# Patient Record
Sex: Female | Born: 1935 | Race: White | Hispanic: No | Smoking: Never smoker
Health system: Southern US, Community
[De-identification: ages and names within clinical notes are randomized; demographics above are authoritative.]

## PROBLEM LIST (undated history)

## (undated) DIAGNOSIS — Z933 Colostomy status: Secondary | ICD-10-CM

## (undated) DIAGNOSIS — I6529 Occlusion and stenosis of unspecified carotid artery: Secondary | ICD-10-CM

## (undated) DIAGNOSIS — R778 Other specified abnormalities of plasma proteins: Secondary | ICD-10-CM

## (undated) DIAGNOSIS — G309 Alzheimer's disease, unspecified: Secondary | ICD-10-CM

## (undated) DIAGNOSIS — C801 Malignant (primary) neoplasm, unspecified: Secondary | ICD-10-CM

## (undated) DIAGNOSIS — F0280 Dementia in other diseases classified elsewhere without behavioral disturbance: Secondary | ICD-10-CM

## (undated) DIAGNOSIS — K219 Gastro-esophageal reflux disease without esophagitis: Secondary | ICD-10-CM

## (undated) DIAGNOSIS — I5189 Other ill-defined heart diseases: Secondary | ICD-10-CM

## (undated) DIAGNOSIS — R7989 Other specified abnormal findings of blood chemistry: Secondary | ICD-10-CM

## (undated) DIAGNOSIS — E039 Hypothyroidism, unspecified: Secondary | ICD-10-CM

## (undated) DIAGNOSIS — I10 Essential (primary) hypertension: Secondary | ICD-10-CM

## (undated) DIAGNOSIS — E785 Hyperlipidemia, unspecified: Secondary | ICD-10-CM

## (undated) HISTORY — PX: ENDARTERECTOMY: SHX5162

## (undated) HISTORY — DX: Other specified abnormalities of plasma proteins: R77.8

## (undated) HISTORY — DX: Other ill-defined heart diseases: I51.89

## (undated) HISTORY — DX: Other specified abnormal findings of blood chemistry: R79.89

## (undated) HISTORY — PX: COLON SURGERY: SHX602

## (undated) HISTORY — PX: HIP SURGERY: SHX245

---

## 2004-05-18 ENCOUNTER — Ambulatory Visit: Payer: Self-pay | Admitting: Internal Medicine

## 2005-06-03 ENCOUNTER — Ambulatory Visit: Payer: Self-pay | Admitting: Internal Medicine

## 2005-06-21 ENCOUNTER — Ambulatory Visit: Payer: Self-pay | Admitting: Internal Medicine

## 2005-07-05 ENCOUNTER — Ambulatory Visit: Payer: Self-pay | Admitting: Vascular Surgery

## 2005-08-03 ENCOUNTER — Inpatient Hospital Stay: Payer: Self-pay | Admitting: Vascular Surgery

## 2006-02-15 ENCOUNTER — Ambulatory Visit: Payer: Self-pay | Admitting: Surgery

## 2006-06-05 ENCOUNTER — Ambulatory Visit: Payer: Self-pay | Admitting: Internal Medicine

## 2007-07-24 ENCOUNTER — Ambulatory Visit: Payer: Self-pay | Admitting: Internal Medicine

## 2008-07-24 ENCOUNTER — Ambulatory Visit: Payer: Self-pay | Admitting: Internal Medicine

## 2008-10-09 ENCOUNTER — Ambulatory Visit: Payer: Self-pay | Admitting: Surgery

## 2009-07-27 ENCOUNTER — Ambulatory Visit: Payer: Self-pay | Admitting: Internal Medicine

## 2009-09-17 ENCOUNTER — Ambulatory Visit: Payer: Self-pay

## 2009-09-23 ENCOUNTER — Inpatient Hospital Stay: Payer: Self-pay | Admitting: Internal Medicine

## 2009-09-30 ENCOUNTER — Encounter: Payer: Self-pay | Admitting: Internal Medicine

## 2009-10-05 ENCOUNTER — Inpatient Hospital Stay: Payer: Self-pay | Admitting: Internal Medicine

## 2009-10-07 ENCOUNTER — Encounter: Payer: Self-pay | Admitting: Internal Medicine

## 2009-11-06 ENCOUNTER — Encounter: Payer: Self-pay | Admitting: Internal Medicine

## 2010-01-29 ENCOUNTER — Ambulatory Visit: Payer: Self-pay | Admitting: Surgery

## 2010-02-03 ENCOUNTER — Ambulatory Visit: Payer: Self-pay | Admitting: Surgery

## 2010-02-11 ENCOUNTER — Inpatient Hospital Stay: Payer: Self-pay | Admitting: Surgery

## 2010-05-02 ENCOUNTER — Emergency Department: Payer: Self-pay | Admitting: Emergency Medicine

## 2010-08-04 ENCOUNTER — Ambulatory Visit: Payer: Self-pay | Admitting: Internal Medicine

## 2011-07-18 ENCOUNTER — Other Ambulatory Visit: Payer: Self-pay | Admitting: Vascular Surgery

## 2011-07-18 LAB — CREATININE, SERUM
EGFR (African American): >60
EGFR (Non-African Amer.): 55 — ABNORMAL LOW

## 2011-07-19 ENCOUNTER — Ambulatory Visit: Payer: Self-pay | Admitting: Vascular Surgery

## 2011-08-01 ENCOUNTER — Ambulatory Visit: Payer: Self-pay | Admitting: Vascular Surgery

## 2011-08-01 DIAGNOSIS — I499 Cardiac arrhythmia, unspecified: Secondary | ICD-10-CM

## 2011-08-01 LAB — BASIC METABOLIC PANEL
Anion Gap: 7 (ref 7–16)
BUN: 11 mg/dL (ref 7–18)
Chloride: 102 mmol/L (ref 98–107)
Co2: 30 mmol/L (ref 21–32)
EGFR (African American): >60
Glucose: 98 mg/dL (ref 65–99)
Potassium: 4.6 mmol/L (ref 3.5–5.1)
Sodium: 139 mmol/L (ref 136–145)

## 2011-08-01 LAB — CBC
HCT: 40.9 % (ref 35.0–47.0)
MCH: 32.2 pg (ref 26.0–34.0)
MCHC: 33.6 g/dL (ref 32.0–36.0)
RBC: 4.27 10*6/uL (ref 3.80–5.20)
RDW: 13.3 % (ref 11.5–14.5)
WBC: 5.2 10*3/uL (ref 3.6–11.0)

## 2011-08-10 ENCOUNTER — Inpatient Hospital Stay: Payer: Self-pay | Admitting: Vascular Surgery

## 2011-08-11 LAB — BASIC METABOLIC PANEL
BUN: 12 mg/dL (ref 7–18)
Chloride: 109 mmol/L — ABNORMAL HIGH (ref 98–107)
Co2: 25 mmol/L (ref 21–32)
Creatinine: 1.05 mg/dL (ref 0.60–1.30)
EGFR (Non-African Amer.): 54 — ABNORMAL LOW
Glucose: 80 mg/dL (ref 65–99)
Osmolality: 286 (ref 275–301)
Potassium: 4 mmol/L (ref 3.5–5.1)

## 2011-08-11 LAB — CBC WITH DIFFERENTIAL/PLATELET
Basophil #: 0 10*3/uL (ref 0.0–0.1)
Basophil %: 0.4 %
Eosinophil #: 0.1 10*3/uL (ref 0.0–0.7)
Eosinophil %: 2.8 %
HCT: 32.9 % — ABNORMAL LOW (ref 35.0–47.0)
HGB: 11 g/dL — ABNORMAL LOW (ref 12.0–16.0)
Lymphocyte #: 0.9 10*3/uL — ABNORMAL LOW (ref 1.0–3.6)
Lymphocyte %: 20.3 %
MCH: 32.2 pg (ref 26.0–34.0)
MCHC: 33.5 g/dL (ref 32.0–36.0)
MCV: 96 fL (ref 80–100)
Monocyte #: 0.7 10*3/uL (ref 0.0–0.7)
Monocyte %: 16.5 %
Neutrophil #: 2.6 10*3/uL (ref 1.4–6.5)
Platelet: 153 10*3/uL (ref 150–440)
RDW: 13.6 % (ref 11.5–14.5)

## 2011-08-11 LAB — PROTIME-INR
INR: 1
Prothrombin Time: 13.4 secs (ref 11.5–14.7)

## 2011-10-06 ENCOUNTER — Ambulatory Visit: Payer: Self-pay | Admitting: Internal Medicine

## 2012-02-22 ENCOUNTER — Ambulatory Visit: Payer: Self-pay | Admitting: Ophthalmology

## 2012-03-05 ENCOUNTER — Ambulatory Visit: Payer: Self-pay | Admitting: Ophthalmology

## 2013-01-15 ENCOUNTER — Inpatient Hospital Stay: Payer: Self-pay | Admitting: Internal Medicine

## 2013-01-15 LAB — BASIC METABOLIC PANEL
Anion Gap: 10 (ref 7–16)
BUN: 13 mg/dL (ref 7–18)
Chloride: 98 mmol/L (ref 98–107)
Co2: 23 mmol/L (ref 21–32)
Creatinine: 1.02 mg/dL (ref 0.60–1.30)
EGFR (African American): 60 — ABNORMAL LOW
Osmolality: 263 (ref 275–301)
Sodium: 131 mmol/L — ABNORMAL LOW (ref 136–145)

## 2013-01-15 LAB — CBC WITH DIFFERENTIAL/PLATELET
Basophil #: 0.1 10*3/uL (ref 0.0–0.1)
Eosinophil #: 0 10*3/uL (ref 0.0–0.7)
Eosinophil %: 0.1 %
HGB: 14.5 g/dL (ref 12.0–16.0)
Lymphocyte #: 0.9 10*3/uL — ABNORMAL LOW (ref 1.0–3.6)
MCHC: 34.9 g/dL (ref 32.0–36.0)
Monocyte #: 1 x10 3/mm — ABNORMAL HIGH (ref 0.2–0.9)
Monocyte %: 9.4 %
Neutrophil #: 8.4 10*3/uL — ABNORMAL HIGH (ref 1.4–6.5)
WBC: 10.3 10*3/uL (ref 3.6–11.0)

## 2013-01-15 LAB — APTT: Activated PTT: 28.9 secs (ref 23.6–35.9)

## 2013-01-15 LAB — TROPONIN I: Troponin-I: <0.02 ng/mL

## 2013-01-15 LAB — PROTIME-INR: Prothrombin Time: 12.4 secs (ref 11.5–14.7)

## 2013-01-16 LAB — TSH: Thyroid Stimulating Horm: 0.324 u[IU]/mL — ABNORMAL LOW

## 2013-01-16 LAB — MAGNESIUM: Magnesium: 1.8 mg/dL

## 2013-01-17 LAB — SODIUM: Sodium: 137 mmol/L (ref 136–145)

## 2013-01-18 ENCOUNTER — Encounter: Payer: Self-pay | Admitting: Internal Medicine

## 2013-01-24 LAB — BASIC METABOLIC PANEL
Calcium, Total: 9.8 mg/dL (ref 8.5–10.1)
Chloride: 102 mmol/L (ref 98–107)
Creatinine: 1.42 mg/dL — ABNORMAL HIGH (ref 0.60–1.30)
Glucose: 104 mg/dL — ABNORMAL HIGH (ref 65–99)
Osmolality: 280 (ref 275–301)
Potassium: 4 mmol/L (ref 3.5–5.1)
Sodium: 137 mmol/L (ref 136–145)

## 2013-01-24 LAB — TSH: Thyroid Stimulating Horm: 8.72 u[IU]/mL — ABNORMAL HIGH

## 2013-01-28 ENCOUNTER — Encounter: Payer: Self-pay | Admitting: Internal Medicine

## 2013-01-28 LAB — URINALYSIS, COMPLETE
Glucose,UR: NEGATIVE mg/dL (ref 0–75)
Ketone: NEGATIVE
Ph: 6 (ref 4.5–8.0)
Specific Gravity: 1.008 (ref 1.003–1.030)

## 2013-01-31 LAB — URINE CULTURE

## 2013-02-04 ENCOUNTER — Ambulatory Visit: Payer: Self-pay | Admitting: Specialist

## 2013-02-06 ENCOUNTER — Encounter: Payer: Self-pay | Admitting: Internal Medicine

## 2013-11-22 ENCOUNTER — Emergency Department: Payer: Self-pay | Admitting: Emergency Medicine

## 2013-11-22 LAB — COMPREHENSIVE METABOLIC PANEL
ANION GAP: 6 — AB (ref 7–16)
Albumin: 4.3 g/dL (ref 3.4–5.0)
Alkaline Phosphatase: 102 U/L
BUN: 19 mg/dL — ABNORMAL HIGH (ref 7–18)
Bilirubin,Total: 1.3 mg/dL — ABNORMAL HIGH (ref 0.2–1.0)
CALCIUM: 9.8 mg/dL (ref 8.5–10.1)
CO2: 25 mmol/L (ref 21–32)
CREATININE: 1.32 mg/dL — AB (ref 0.60–1.30)
Chloride: 106 mmol/L (ref 98–107)
EGFR (African American): 44 — ABNORMAL LOW
GFR CALC NON AF AMER: 38 — AB
GLUCOSE: 143 mg/dL — AB (ref 65–99)
Osmolality: 279 (ref 275–301)
Potassium: 4 mmol/L (ref 3.5–5.1)
SGOT(AST): 41 U/L — ABNORMAL HIGH (ref 15–37)
SGPT (ALT): 34 U/L (ref 12–78)
Sodium: 137 mmol/L (ref 136–145)
TOTAL PROTEIN: 7.6 g/dL (ref 6.4–8.2)

## 2013-11-22 LAB — CBC
HCT: 44.2 % (ref 35.0–47.0)
HGB: 14.8 g/dL (ref 12.0–16.0)
MCH: 32.2 pg (ref 26.0–34.0)
MCHC: 33.4 g/dL (ref 32.0–36.0)
MCV: 96 fL (ref 80–100)
Platelet: 205 10*3/uL (ref 150–440)
RBC: 4.6 10*6/uL (ref 3.80–5.20)
RDW: 12.9 % (ref 11.5–14.5)
WBC: 11.6 10*3/uL — ABNORMAL HIGH (ref 3.6–11.0)

## 2013-11-22 LAB — TROPONIN I

## 2014-08-26 NOTE — Op Note (Signed)
PATIENT NAME:  Rachel Rush, Rachel Rush MR#:  [id number] DATE OF BIRTH:  11/12/1929  DATE OF PROCEDURE:  03/05/2012  PREOPERATIVE DIAGNOSIS:  Cataract, right eye.   POSTOPERATIVE DIAGNOSIS:  Cataract, right eye.  PROCEDURE PERFORMED:  Extracapsular cataract extraction using phacoemulsification with placement of an Alcon [id number], 20.5-diopter posterior chamber lens, serial # [id number].  SURGEON:  Loura Back. Tinisha Etzkorn, MD  ASSISTANT:  None.  ANESTHESIA:  4% lidocaine and 0.75% Marcaine in a 50/50 mixture with 10 units/mL of Hylenex added, given as peribulbar.  ANESTHESIOLOGIST:  Dr. Benjamine Mola   COMPLICATIONS:  None.  ESTIMATED BLOOD LOSS:  Less than 1 mL.  DESCRIPTION OF PROCEDURE:  The patient was brought to the operating room and given a peribulbar block.  The patient was then prepped and draped in the usual fashion.  The vertical rectus muscles were imbricated using 5-0 silk sutures.  These sutures were then clamped to the sterile drapes as bridle sutures.  A limbal peritomy was performed extending two clock hours and hemostasis was obtained with cautery.  A partial thickness scleral groove was made at the surgical limbus and dissected anteriorly in a lamellar dissection using an Alcon crescent knife.  The anterior chamber was entered superonasally with a Superblade and through the lamellar dissection with a 2.6 mm keratome.  DisCoVisc was used to replace the aqueous and a continuous tear capsulorrhexis was carried out.  Hydrodissection and hydrodelineation were carried out with balanced salt and a 27 gauge canula.  The nucleus was rotated to confirm the effectiveness of the hydrodissection.  Phacoemulsification was carried out using a divide-and-conquer technique.  Total ultrasound time was 2 minutes and 18 seconds with an average power of 19.2 percent, CDE 44.02.  Irrigation/aspiration was used to remove the residual cortex.  DisCoVisc was used to inflate the capsule and the internal incision  was enlarged to 3 mm with the crescent knife.  The intraocular lens was folded and inserted into the capsular bag using the AcrySert delivery system.  Irrigation/aspiration was used to remove the residual DisCoVisc.  Miostat was injected into the anterior chamber through the paracentesis track to inflate the anterior chamber and induce miosis.  The wound was checked for leaks and none were found. The conjunctiva was closed with cautery and the bridle sutures were removed.  Two drops of 0.3% Vigamox were placed on the eye.   An eye shield was placed on the eye.  The patient was discharged to the recovery room in good condition.  ____________________________ Loura Back Nesta Kimple, MD sad:drc D: 03/05/2012 13:49:34 ET T: 03/05/2012 14:27:02 ET JOB#: [id number]  cc: Remo Lipps A. Deyna Carbon, MD, <Dictator> Martie Lee MD ELECTRONICALLY SIGNED 03/07/2012 14:59

## 2014-08-29 NOTE — Consult Note (Signed)
Brief Consult Note: Diagnosis: Left hip greater trochanteric hip fracture.   Patient was seen by consultant.   Recommend to proceed with surgery or procedure.   Recommend further assessment or treatment.   Orders entered.   Comments: 82 year old female fell in her yard yesterday and injured the left hip.  Brought to Emergency Room where exam and X-rays show a non displaced fracture of the left greater trochanter where she has a total hip done by Dr Marry Guan years ago.  Lives alone so she was admitted for pain control and placement.  complains of modereate pain currently.  Daughter in law present for exam and discussion.   Exam:  Thin female lying quietly in bed.  Leg lengths equal.  range of motion of hip painful.  circulation/sensation/motor function good.  Hip stable.  No bruising noted as yet.  No other complaints voiced.    X-rays: as above.  No evidence of loosening of components.  Trochanter non displaced.  Femur cemented.   Rx:  symptomatic care..ice, pain meds        Physical Therapy: partial weight bearing with walker on left        Will need skilled nursing facility.          follow-up:  2-3 weeks with me or Dr Marry Guan at family's discretion.  Electronic Signatures: Park Breed (MD)  (Signed 10-Sep-14 13:08)  Authored: Brief Consult Note   Last Updated: 10-Sep-14 13:08 by Park Breed (MD)

## 2014-08-29 NOTE — Discharge Summary (Signed)
PATIENT NAME:  Rachel Rush, Rachel Rush MR#:  [id number] DATE OF BIRTH:  [birth year]  DISCHARGE DIAGNOSES:  1.  Hip fracture, conservative management. Had hip replacement in past.  2.  Hyponatremia due to dehydration.  3.  Colostomy and constipation.  4.  Hypertension.     CONDITION ON DISCHARGE: Stable.   CODE STATUS: FULL CODE.   MEDICATIONS ON DISCHARGE:  1.  Prevacid 30 mg delayed-release capsule once a day.  2.  Vitamin 400 international units once a day.  3.  Fosamax 70 mg oral once a week on Thursday.  4.  Aspirin 81 mg once a day in the morning.  5.  Fish oil 1 gram two times a day.  6.  Levothyroxine 88 mcg once a day.  7.  Vitamin B12 1000 mcg injectable once a month. 9.  Tramadol 50 mg every six hours as needed for pain.  10.  Clopidogrel 75 mg oral once a day.  11.  Metoprolol 25 mg two times a day.  12.  Amlodipine 10 mg oral tablet take 1/2 tablet once a day.  13.  Donepezil 10 mg oral tablet once a day.  14.  Docusate sodium 100 mg oral capsule 2 times a day as needed for constipation.  15.  Metoclopramide 5 mg oral tablet 4 times a day before meals and as needed for nausea and vomiting.  16.  Calcium and vitamin D tablet 2 times a day with meals.   DIET ON DISCHARGE: Low sodium.   ACTIVITY: As tolerated.   TIMEFRAME TO FOLLOW-UP: Within 1 to 2 weeks. Advised to have follow-up in orthopedics, Dr. Delman Cheadle office.   HISTORY OF PRESENT ILLNESS: This is an 81 year old female with medical history of total hip replacement on the left side, hypertension, osteoporosis, hypothyroidism, gastroesophageal reflux disease, who was in her usual state of health, was walking with a cane and she fell down, which was a mechanical fall.   Came to the Emergency Room, and on x-ray it was found fracture around the hardware on left hip. Discuss this finding with orthopedic doctor and he suggested go for pain management and rehab. There was no surgical interventions offered by him for this  patient in this admission.   The patient was admitted to medical floor for pain management and physical therapy and she remained stable, and we are discharging to rehab.   HOSPITAL COURSE AND STAY: The patient stayed in the hospital for pain management and her rehab placement issues. Her blood pressure was running high which came under control with the addition of extra medication and then it came under control. She was a little dehydrated. We gave IV fluid and there was also hyponatremia which came under control and became normal after replacement. 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 For hypothyroidism, we continued her home medications.   IMPORTANT LABORATORY RESULTS IN THE HOSPITAL: Hip, left complete greater trochanteric fracture. Creatinine 1.02, sodium 131, potassium 3.9, chloride 98. White cell count 10.3, hemoglobin 14.5, platelet count 217. INR 0.9. Troponin less than 0.02. Magnesium 1.5.    Abdominal x-ray was done and which was  consistent with constipation. No obvious ascites or obstruction was found.   TOTAL TIME SPENT ON THIS DISCHARGE: 40 minutes.   ____________________________ Ceasar Lund Anselm Jungling, MD vgv:np D: 01/18/2013 10:50:02 ET T: 01/18/2013 17:32:56 ET  JOB#: [id number]  cc: Ceasar Lund. Anselm Jungling, MD, <Dictator> Park Breed, MD Isla Pence, MD Vaughan Basta MD ELECTRONICALLY SIGNED 01/22/2013 23:25

## 2014-08-29 NOTE — H&P (Signed)
PATIENT NAME:  Rachel Rush, HULGAN MR#:  [id number] DATE OF BIRTH:  11/05/1929  DATE OF ADMISSION:  01/15/2013  PRIMARY CARE PHYSICIAN: Dr. Rosario Jacks  ER REFERRING PHYSICIAN: Dr. Mariea Clonts   CHIEF COMPLAINT: Fall.   HISTORY OF PRESENT ILLNESS: An 81 year old female with past medical history of total hip replacement on the left side, hypertension, osteoporosis, hypothyroidism, gastroesophageal reflux disease who was in her usual state of health.  She is walking with a cane.  Today, she fell down, which was a mechanical fall, and she had her hip fracture on the same side on the left, came to the Emergency Room; and as per ER physician, x-ray is done which shows the fracture is around her hardware on the bone. She discussed the finding, and Dr. Sabra Heck reviewed the x-ray, and he said there is no surgical treatment for this.  He suggested to go for the pain management and then  rehab, so she is being admitted to medical service for further management of this issue. On further questioning, the patient denies any other complaint other than some pain in the left hip, and she did not recall any syncope or any other complaint. Her daughter-in-law and her son were there when fell down, and there were no other complaints.   REVIEW OF SYSTEMS:   CONSTITUTIONAL: Negative for fever, fatigue, weakness, pain or weight loss.  EYES: No blurring, double vision or discharge.  EARS, NOSE, THROAT: No tinnitus, ear pain or hearing loss.  RESPIRATORY: No cough, wheezing, hemoptysis or shortness of breath.  CARDIOVASCULAR: No chest pain, orthopnea, edema or palpitations.  GASTROINTESTINAL: No nausea, vomiting, diarrhea or abdominal pain.  GENITOURINARY: No dysuria, hematuria or increased frequency.  ENDOCRINE: No increased sweating. No heat or cold intolerance.  SKIN: No acne or rashes.  MUSCULOSKELETAL: No pain or swelling in the joints.  NEUROLOGICAL: No numbness, weakness, tremors or vertigo.  PSYCHIATRIC: No anxiety,  insomnia or bipolar disorder. The patient is complaining of some pain in the left hip but is still able to move her left foot.    PAST MEDICAL HISTORY:  1.  Back pain and T12 vertebrobasilar fracture.  2.  Hypertension.  3.  Osteoporosis.  4.  Irritable bowel syndrome.  5.  Bilateral carotid artery stenosis, status post endarterectomy.  6.  Hypothyroidism.  7.  Gastroesophageal reflux disease.   PAST SURGICAL HISTORY:  1.  Colon cancer, status post resection and colostomy since age of 89.  2.  Next left hip replacement.  3.  Hernia repair.  4.  Hysterectomy.  5.  Cholecystectomy  6.  Appendectomy.   FAMILY HISTORY: Mother had coronary artery disease and diabetes.   SOCIAL HISTORY: She lives at home. She is independent in her day-to-day activity using cane to walk, and her son and daughter-in-law live next door and are taking care of her other requirements.   HOME MEDICATIONS:   1.  Vitamin E 400 international units once a day.  2.  Vitamin B12 1000 mcg injectable once a month.  3.  Prevacid 30 mg delayed-capsule, 1 capsule once a day.  4.  Plavix 75 mg oral once a day.  5.  Levothyroxine 88 mcg once a day.  6.  Fosamax 70 mg oral tablet once a week.  7.  Fish oil 1 gram 2 times a day.  8.  Dicyclomine 10 mg capsule 3 times a day.  9.  Centrum Silver once a day.  10.  Calcium plus vitamin D tablet 3 times a day.  11.  Aspirin 81 mg once a day.  12.  Aricept 10 mg oral tablet once a day   PHYSICAL EXAMINATION: VITAL SIGNS: In the ER, temperature 98.4, pulse 71, respirations 18, blood pressure 170/103.  GENERAL: The patient is fully alert and oriented to time, place and person, does not appear in any acute distress.  HEAD, EYES, EARS, NOSE, AND THROAT: Head and neck atraumatic. Conjunctivae pink. Oral mucosa moist.  NECK: Supple. No JVD.  RESPIRATORY: Bilateral clear and equal air entry.  CARDIOVASCULAR: S1, S2 present, regular. No murmur.  ABDOMEN: Soft, nontender.  Colostomy bag present. Bowel sounds present.  SKIN: No rashes.  LEGS: No edema.  NEUROLOGICAL: Power 5 out of 5 except left lower limb, which she is not moving too much due to pain in the hip. JOINTS: No swelling or tenderness.  PSYCHIATRIC: Does not appear in any acute psychiatric illness.   IMPORTANT LAB RESULTS: Glucose 96, BUN 13, creatinine 1.02, sodium 131, potassium 3.9, CO2 23, anion gap 10, calcium 10. Troponin less than 0.02. White cell count 10.3, hemoglobin 14.5, platelet count 217, MCV 93, INR 0.9.   ASSESSMENT AND PLAN: An 80 year old female with history of total left hip replacement surgery in the past and osteoporosis, had a fall today and developed a fracture of the bone around her prosthesis. ER physician spoke to Dr. Sabra Heck, who is covering orthopedic surgeon, and there is no surgical intervention at this time.   1.  Hip fracture: As per Ortho, there is no surgical intervention treatment, so the treatment is pain management and rehab. We will arrange for it, so she is admitted to medical care.  2.  Osteoporosis: We will give her vitamin D and calcium.  3.  History of carotid artery stenosis and endarterectomy:  We will continue aspirin and Plavix as she was taking at home.  4.  Hypothyroidism:  We will continue levothyroxine.  5.  Gastroesophageal reflux disease:  We will continue pantoprazole.  6.  Hypertension: We will continue telmisartan.   CODE STATUS:  FULL CODE.        TOTAL TIME SPENT: 50 minutes in this admission.    ____________________________ Ceasar Lund Anselm Jungling, MD vgv:cb D: 01/15/2013 21:33:53 ET T: 01/15/2013 21:48:19 ET JOB#: [id number]  cc: Ceasar Lund. Anselm Jungling, MD, <Dictator> Vaughan Basta MD ELECTRONICALLY SIGNED 01/22/2013 23:23

## 2014-08-31 NOTE — Op Note (Signed)
PATIENT NAME:  Rachel Rush, Rachel Rush MR#:  [id number] DATE OF BIRTH:  [birth year]  DATE OF PROCEDURE:  08/10/2011  PREOPERATIVE DIAGNOSES: 1. High-grade right carotid artery stenosis.  2. Status post left carotid endarterectomy.  3. Hypertension.   POSTOPERATIVE DIAGNOSES:  1. High-grade right carotid artery stenosis.  2. Status post left carotid endarterectomy.  3. Hypertension.   PROCEDURE: Right carotid endarterectomy with  Dacron patch angioplasty.   SURGEON: Algernon Huxley, M.D.   ASSISTANT:  Real Cons, PA   ANESTHESIA: General.   ESTIMATED BLOOD LOSS: Approximately 50 mL.   INDICATION FOR PROCEDURE: 81 year old white female with history of carotid disease. She has had progression of her disease and most recent ultrasound and CT angiogram demonstrated high-grade stenosis on the right. This is asymptomatic but for stroke risk reduction we discussed endarterectomy for this high-grade lesion. She is already status post left carotid endarterectomy. She desired to proceed.   DESCRIPTION OF PROCEDURE: The patient was brought to the operative suite and after an adequate level of general endotracheal anesthesia was obtained, her head was flexed and turned to the side. She was placed in modified beach chair position and her neck was then sterilely prepped and draped. An incision was created along the anterior border of the sternocleidomastoid and we dissected down through the platysma with electrocautery and then used a Weitlaner  retractor to facilitate our exposure. The carotid artery was identified. There was not a dominant facial vein. A couple of venous branches were ligated and divided between silk ties, exposing the carotid bifurcation. The common carotid artery, external carotid artery, and superior thyroid artery, and internal carotid artery distal to the lesion were all encircled with vessel loops. The patient was systemically heparinized with 5000 units of intravenous heparin. This was  allowed to circulate for approximately five minutes and then control was pulled up on the vessel loops. An anterior wall arteriotomy was created with a #11 blade and extended with Potts scissors. A Pruitt-Inahara shunt was placed first in the internal carotid artery, flushed and de-aired, and then in the common carotid artery, and then flow was restored.  I then performed an endarterectomy in the typical fashion with the [facility].  The proximal endpoint was cut flush with tenotomy scissors. An eversion endarterectomy was performed on the external carotid artery and a nice feathered distal endpoint was created with gentle traction on the distal endpoint. Two 7-0 Prolene sutures were used to tack down the distal endpoint. All loose flecks were removed. The vessel was locally heparinized. Due to the small nature of the vessel a Dacron patch was selected to close the arteriotomy. This was cut and beveled and started at the distal endpoint. It was run one-half the length of the arteriotomy and then the Dacron patch cut and beveled to an appropriate length to match the arteriotomy.  It was beveled proximally and a second 6-0 Prolene was started proximally.  This was run medially and the suture lines were tied together. This was running approximately one-quarter of the length of the arteriotomy laterally and then the Pruitt-Inahara shunt was removed. The vessel was flushed in the external, internal, and common carotid artery and locally heparinized. Then I completed the suture line, flushing through the external carotid artery several cardiac cycles before release of control. Three 6-0 Prolene patch sutures were used for hemostasis and hemostasis was complete. Surgicel and Evicyl topical hemostatic agents were placed as well. The wound was then closed with 3 interrupted 3-0 Vicryl sutures in  the sternocleidomastoid.  The platysma was closed with a running 3-0 Vicryl and the skin was closed with 4-0 Monocryl.  Dermabond was placed as a dressing. The patient tolerated the procedure well and was taken to the recovery room in stable condition.    ____________________________ Algernon Huxley, MD jsd:bjt D: 08/10/2011 09:29:54 ET T: 08/10/2011 12:50:06 ET JOB#: [id number]  cc: Algernon Huxley, MD, <Dictator> Isla Pence, MD Algernon Huxley MD ELECTRONICALLY SIGNED 08/10/2011 15:05

## 2015-03-24 ENCOUNTER — Observation Stay
Admission: EM | Admit: 2015-03-24 | Discharge: 2015-03-27 | Disposition: A | Discharge status: Skilled Nursing Facility | Payer: Medicare Other | Attending: Internal Medicine | Admitting: Internal Medicine

## 2015-03-24 ENCOUNTER — Emergency Department: Payer: Medicare Other

## 2015-03-24 ENCOUNTER — Encounter: Payer: Self-pay | Admitting: *Deleted

## 2015-03-24 DIAGNOSIS — R7989 Other specified abnormal findings of blood chemistry: Secondary | ICD-10-CM | POA: Insufficient documentation

## 2015-03-24 DIAGNOSIS — Z885 Allergy status to narcotic agent status: Secondary | ICD-10-CM | POA: Insufficient documentation

## 2015-03-24 DIAGNOSIS — K219 Gastro-esophageal reflux disease without esophagitis: Secondary | ICD-10-CM | POA: Insufficient documentation

## 2015-03-24 DIAGNOSIS — R748 Abnormal levels of other serum enzymes: Secondary | ICD-10-CM | POA: Diagnosis not present

## 2015-03-24 DIAGNOSIS — E785 Hyperlipidemia, unspecified: Secondary | ICD-10-CM | POA: Diagnosis not present

## 2015-03-24 DIAGNOSIS — M4854XA Collapsed vertebra, not elsewhere classified, thoracic region, initial encounter for fracture: Secondary | ICD-10-CM | POA: Insufficient documentation

## 2015-03-24 DIAGNOSIS — Z859 Personal history of malignant neoplasm, unspecified: Secondary | ICD-10-CM | POA: Insufficient documentation

## 2015-03-24 DIAGNOSIS — G309 Alzheimer's disease, unspecified: Secondary | ICD-10-CM | POA: Diagnosis not present

## 2015-03-24 DIAGNOSIS — Z79899 Other long term (current) drug therapy: Secondary | ICD-10-CM | POA: Diagnosis not present

## 2015-03-24 DIAGNOSIS — N289 Disorder of kidney and ureter, unspecified: Secondary | ICD-10-CM | POA: Diagnosis not present

## 2015-03-24 DIAGNOSIS — Z933 Colostomy status: Secondary | ICD-10-CM | POA: Insufficient documentation

## 2015-03-24 DIAGNOSIS — I517 Cardiomegaly: Secondary | ICD-10-CM | POA: Diagnosis not present

## 2015-03-24 DIAGNOSIS — I501 Left ventricular failure: Secondary | ICD-10-CM | POA: Insufficient documentation

## 2015-03-24 DIAGNOSIS — N390 Urinary tract infection, site not specified: Secondary | ICD-10-CM | POA: Diagnosis not present

## 2015-03-24 DIAGNOSIS — Z7982 Long term (current) use of aspirin: Secondary | ICD-10-CM | POA: Diagnosis not present

## 2015-03-24 DIAGNOSIS — I071 Rheumatic tricuspid insufficiency: Secondary | ICD-10-CM | POA: Insufficient documentation

## 2015-03-24 DIAGNOSIS — I214 Non-ST elevation (NSTEMI) myocardial infarction: Principal | ICD-10-CM | POA: Diagnosis present

## 2015-03-24 DIAGNOSIS — Z8249 Family history of ischemic heart disease and other diseases of the circulatory system: Secondary | ICD-10-CM | POA: Insufficient documentation

## 2015-03-24 DIAGNOSIS — B9689 Other specified bacterial agents as the cause of diseases classified elsewhere: Secondary | ICD-10-CM | POA: Insufficient documentation

## 2015-03-24 DIAGNOSIS — R079 Chest pain, unspecified: Secondary | ICD-10-CM

## 2015-03-24 DIAGNOSIS — M8580 Other specified disorders of bone density and structure, unspecified site: Secondary | ICD-10-CM | POA: Insufficient documentation

## 2015-03-24 DIAGNOSIS — R410 Disorientation, unspecified: Secondary | ICD-10-CM | POA: Insufficient documentation

## 2015-03-24 DIAGNOSIS — D72829 Elevated white blood cell count, unspecified: Secondary | ICD-10-CM

## 2015-03-24 DIAGNOSIS — I340 Nonrheumatic mitral (valve) insufficiency: Secondary | ICD-10-CM | POA: Diagnosis not present

## 2015-03-24 DIAGNOSIS — Z91013 Allergy to seafood: Secondary | ICD-10-CM | POA: Insufficient documentation

## 2015-03-24 DIAGNOSIS — I272 Other secondary pulmonary hypertension: Secondary | ICD-10-CM | POA: Diagnosis not present

## 2015-03-24 DIAGNOSIS — F0280 Dementia in other diseases classified elsewhere without behavioral disturbance: Secondary | ICD-10-CM | POA: Diagnosis not present

## 2015-03-24 DIAGNOSIS — I6529 Occlusion and stenosis of unspecified carotid artery: Secondary | ICD-10-CM | POA: Insufficient documentation

## 2015-03-24 DIAGNOSIS — I10 Essential (primary) hypertension: Secondary | ICD-10-CM | POA: Insufficient documentation

## 2015-03-24 DIAGNOSIS — E039 Hypothyroidism, unspecified: Secondary | ICD-10-CM | POA: Diagnosis not present

## 2015-03-24 DIAGNOSIS — R531 Weakness: Secondary | ICD-10-CM | POA: Diagnosis not present

## 2015-03-24 DIAGNOSIS — E860 Dehydration: Secondary | ICD-10-CM | POA: Diagnosis not present

## 2015-03-24 DIAGNOSIS — R778 Other specified abnormalities of plasma proteins: Secondary | ICD-10-CM

## 2015-03-24 HISTORY — DX: Dementia in other diseases classified elsewhere, unspecified severity, without behavioral disturbance, psychotic disturbance, mood disturbance, and anxiety: F02.80

## 2015-03-24 HISTORY — DX: Colostomy status: Z93.3

## 2015-03-24 HISTORY — DX: Malignant (primary) neoplasm, unspecified: C80.1

## 2015-03-24 HISTORY — DX: Gastro-esophageal reflux disease without esophagitis: K21.9

## 2015-03-24 HISTORY — DX: Occlusion and stenosis of unspecified carotid artery: I65.29

## 2015-03-24 HISTORY — DX: Hyperlipidemia, unspecified: E78.5

## 2015-03-24 HISTORY — DX: Hypothyroidism, unspecified: E03.9

## 2015-03-24 HISTORY — DX: Essential (primary) hypertension: I10

## 2015-03-24 HISTORY — DX: Alzheimer's disease, unspecified: G30.9

## 2015-03-24 LAB — TROPONIN I: Troponin I: 0.07 ng/mL — ABNORMAL HIGH (ref <0.031)

## 2015-03-24 LAB — BASIC METABOLIC PANEL
Anion gap: 7 (ref 5–15)
BUN: 26 mg/dL — AB (ref 6–20)
CHLORIDE: 105 mmol/L (ref 101–111)
CO2: 27 mmol/L (ref 22–32)
CREATININE: 1.15 mg/dL — AB (ref 0.44–1.00)
Calcium: 9.5 mg/dL (ref 8.9–10.3)
GFR calc Af Amer: 50 mL/min — ABNORMAL LOW (ref >60)
GFR calc non Af Amer: 43 mL/min — ABNORMAL LOW (ref >60)
GLUCOSE: 121 mg/dL — AB (ref 65–99)
POTASSIUM: 4.3 mmol/L (ref 3.5–5.1)
Sodium: 139 mmol/L (ref 135–145)

## 2015-03-24 LAB — HEPATIC FUNCTION PANEL
ALK PHOS: 86 U/L (ref 38–126)
ALT: 27 U/L (ref 14–54)
AST: 29 U/L (ref 15–41)
Albumin: 4.6 g/dL (ref 3.5–5.0)
BILIRUBIN INDIRECT: 1.4 mg/dL — AB (ref 0.3–0.9)
BILIRUBIN TOTAL: 1.8 mg/dL — AB (ref 0.3–1.2)
Bilirubin, Direct: 0.4 mg/dL (ref 0.1–0.5)
TOTAL PROTEIN: 7.5 g/dL (ref 6.5–8.1)

## 2015-03-24 LAB — CBC
HCT: 44.3 % (ref 35.0–47.0)
Hemoglobin: 14.2 g/dL (ref 12.0–16.0)
MCH: 30.5 pg (ref 26.0–34.0)
MCHC: 32 g/dL (ref 32.0–36.0)
MCV: 95.4 fL (ref 80.0–100.0)
PLATELETS: 212 10*3/uL (ref 150–440)
RBC: 4.65 MIL/uL (ref 3.80–5.20)
RDW: 14.8 % — AB (ref 11.5–14.5)
WBC: 14.4 10*3/uL — ABNORMAL HIGH (ref 3.6–11.0)

## 2015-03-24 LAB — URINALYSIS COMPLETE WITH MICROSCOPIC (ARMC ONLY)
BILIRUBIN URINE: NEGATIVE
Bacteria, UA: NONE SEEN
Glucose, UA: NEGATIVE mg/dL
Hgb urine dipstick: NEGATIVE
Ketones, ur: NEGATIVE mg/dL
Nitrite: NEGATIVE
Protein, ur: NEGATIVE mg/dL
RBC / HPF: NONE SEEN RBC/hpf (ref 0–5)
SQUAMOUS EPITHELIAL / LPF: NONE SEEN
Specific Gravity, Urine: 1.011 (ref 1.005–1.030)
pH: 5 (ref 5.0–8.0)

## 2015-03-24 LAB — BRAIN NATRIURETIC PEPTIDE: B Natriuretic Peptide: 98 pg/mL (ref 0.0–100.0)

## 2015-03-24 MED ORDER — ACETAMINOPHEN 325 MG PO TABS: 650.0000 mg | ORAL_TABLET | Freq: Four times a day (QID) | ORAL | Status: DC | PRN

## 2015-03-24 MED ORDER — ONDANSETRON HCL 4 MG/2ML IJ SOLN: 4.0000 mg | Freq: Four times a day (QID) | INTRAMUSCULAR | Status: DC | PRN

## 2015-03-24 MED ORDER — CIPROFLOXACIN HCL 500 MG PO TABS
500.0000 mg | ORAL_TABLET | Freq: Two times a day (BID) | ORAL | Status: DC
  Administered 2015-03-24 – 2015-03-25 (×2): 500 mg via ORAL
  Filled 2015-03-24 (×2): qty 1

## 2015-03-24 MED ORDER — ADULT MULTIVITAMIN W/MINERALS CH
1.0000 | ORAL_TABLET | Freq: Every evening | ORAL | Status: DC
  Administered 2015-03-24 – 2015-03-26 (×3): 1 via ORAL
  Filled 2015-03-24 (×3): qty 1

## 2015-03-24 MED ORDER — ASPIRIN EC 81 MG PO TBEC
81.0000 mg | DELAYED_RELEASE_TABLET | Freq: Every day | ORAL | Status: DC
  Administered 2015-03-25 – 2015-03-27 (×3): 81 mg via ORAL
  Filled 2015-03-24 (×3): qty 1

## 2015-03-24 MED ORDER — VITAMIN E 180 MG (400 UNIT) PO CAPS
400.0000 [IU] | ORAL_CAPSULE | Freq: Every day | ORAL | Status: DC
  Administered 2015-03-25 – 2015-03-27 (×3): 400 [IU] via ORAL
  Filled 2015-03-24 (×3): qty 1

## 2015-03-24 MED ORDER — ASPIRIN 81 MG PO CHEW
324.0000 mg | CHEWABLE_TABLET | Freq: Once | ORAL | Status: AC
Start: 2015-03-24 — End: 2015-03-24
  Administered 2015-03-24: 324 mg via ORAL
  Filled 2015-03-24: qty 4

## 2015-03-24 MED ORDER — PANTOPRAZOLE SODIUM 20 MG PO TBEC
20.0000 mg | DELAYED_RELEASE_TABLET | Freq: Every day | ORAL | Status: DC
  Filled 2015-03-24: qty 1

## 2015-03-24 MED ORDER — IBUPROFEN 200 MG PO TABS
400.0000 mg | ORAL_TABLET | Freq: Every evening | ORAL | Status: DC
  Administered 2015-03-25 – 2015-03-26 (×2): 400 mg via ORAL
  Filled 2015-03-24 (×2): qty 2

## 2015-03-24 MED ORDER — CALCIUM CARB-CHOLECALCIFEROL 600-800 MG-UNIT PO TABS: 1.0000 | ORAL_TABLET | Freq: Two times a day (BID) | ORAL | Status: DC

## 2015-03-24 MED ORDER — HYDRALAZINE HCL 20 MG/ML IJ SOLN
10.0000 mg | Freq: Four times a day (QID) | INTRAMUSCULAR | Status: DC | PRN
  Administered 2015-03-25: 10 mg via INTRAVENOUS
  Filled 2015-03-24 (×2): qty 1

## 2015-03-24 MED ORDER — DONEPEZIL HCL 5 MG PO TABS
5.0000 mg | ORAL_TABLET | Freq: Every day | ORAL | Status: DC
  Administered 2015-03-25 – 2015-03-27 (×3): 5 mg via ORAL
  Filled 2015-03-24 (×3): qty 1

## 2015-03-24 MED ORDER — OMEGA-3-ACID ETHYL ESTERS 1 G PO CAPS
1.0000 g | ORAL_CAPSULE | Freq: Every day | ORAL | Status: DC
  Administered 2015-03-25 – 2015-03-27 (×3): 1 g via ORAL
  Filled 2015-03-24 (×3): qty 1

## 2015-03-24 MED ORDER — MAPROTILINE HCL 50 MG PO TABS
75.0000 mg | ORAL_TABLET | Freq: Every day | ORAL | Status: DC
  Administered 2015-03-25 – 2015-03-27 (×3): 75 mg via ORAL
  Filled 2015-03-24 (×4): qty 1

## 2015-03-24 MED ORDER — CALCIUM CARBONATE-VITAMIN D 500-200 MG-UNIT PO TABS
1.0000 | ORAL_TABLET | Freq: Two times a day (BID) | ORAL | Status: DC
  Administered 2015-03-24 – 2015-03-27 (×6): 1 via ORAL
  Filled 2015-03-24 (×6): qty 1

## 2015-03-24 MED ORDER — ONDANSETRON HCL 4 MG PO TABS: 4.0000 mg | ORAL_TABLET | Freq: Four times a day (QID) | ORAL | Status: DC | PRN

## 2015-03-24 MED ORDER — VITAMIN B-12 1000 MCG PO TABS
1000.0000 ug | ORAL_TABLET | Freq: Every day | ORAL | Status: DC
  Administered 2015-03-25 – 2015-03-27 (×3): 1000 ug via ORAL
  Filled 2015-03-24 (×3): qty 1

## 2015-03-24 MED ORDER — ACETAMINOPHEN 650 MG RE SUPP: 650.0000 mg | Freq: Four times a day (QID) | RECTAL | Status: DC | PRN

## 2015-03-24 MED ORDER — NITROGLYCERIN 2 % TD OINT
0.5000 [in_us] | TOPICAL_OINTMENT | Freq: Once | TRANSDERMAL | Status: AC
  Administered 2015-03-24: 0.5 [in_us] via TOPICAL
  Filled 2015-03-24: qty 1

## 2015-03-24 MED ORDER — LEVOTHYROXINE SODIUM 100 MCG PO TABS
100.0000 ug | ORAL_TABLET | Freq: Every day | ORAL | Status: DC
  Administered 2015-03-25 – 2015-03-27 (×3): 100 ug via ORAL
  Filled 2015-03-24 (×3): qty 1

## 2015-03-24 MED ORDER — METOPROLOL TARTRATE 25 MG PO TABS
25.0000 mg | ORAL_TABLET | Freq: Two times a day (BID) | ORAL | Status: DC
Start: 2015-03-24 — End: 2015-03-27
  Administered 2015-03-24 – 2015-03-27 (×6): 25 mg via ORAL
  Filled 2015-03-24 (×6): qty 1

## 2015-03-24 MED ORDER — SODIUM CHLORIDE 0.9 % IV SOLN
Freq: Once | INTRAVENOUS | Status: AC
  Administered 2015-03-24: 23:00:00 via INTRAVENOUS

## 2015-03-24 MED ORDER — NITROGLYCERIN 2 % TD OINT
TOPICAL_OINTMENT | TRANSDERMAL | Status: AC
  Filled 2015-03-24: qty 1

## 2015-03-24 MED ORDER — ENOXAPARIN SODIUM 40 MG/0.4ML ~~LOC~~ SOLN
40.0000 mg | SUBCUTANEOUS | Status: DC
  Administered 2015-03-24: 40 mg via SUBCUTANEOUS
  Filled 2015-03-24: qty 0.4

## 2015-03-24 NOTE — ED Provider Notes (Signed)
[facility] Emergency Department Provider Note  ____________________________________________  Time seen: Approximately 4:18 PM  I have reviewed the triage vital signs and the nursing notes.   HISTORY  Chief Complaint Chest Pain    HPI Rachel Rush is a 81 y.o. female patient lives at home. 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 This is work for 3 years but in the last week patient has been much more absent-minded. Today the son went to check on her 5:30 in the morning before going to work and found her sitting on the lower in the kitchen complaining of chest pain. She had not pushed her life alert which she wears on her neck. Patient tells me she has pain in the lower part of her chest she thinks is been there all day. She cannot tell me if anything makes it better or worse. She cannot tell me how the pain feels or if it radiates anywhere. Patient does say she is not short of breath she is not sweaty she's not nauseated she does not have any pain with deep breathing. He tells me she is not coughing and does not think she's running a fever. She cannot tell me when the pain started or if it's waxed and waned during the course of the day. Son is worried about her decline this week and thinks that he may need to place her.   Past Medical History  Diagnosis Date  . Thyroid disease   . Cancer ([place])   . GERD (gastroesophageal reflux disease)   . Hypertension   . Stenosis of carotid artery     There are no active problems to display for this patient.   Past Surgical History  Procedure Laterality Date  . Colon surgery    . Hip surgery    . Endarterectomy      Current Outpatient Rx  Name  Route  Sig  Dispense  Refill  . donepezil (ARICEPT) 5 MG tablet   Oral   Take 5 mg by mouth at bedtime.         . lansoprazole (PREVACID) 30 MG capsule   Oral   Take 30 mg by mouth daily at 12 noon.          Marland Kitchen levothyroxine (SYNTHROID, LEVOTHROID) 100 MCG tablet   Oral   Take 100 mcg by mouth daily before breakfast.           Allergies Cod and Morphine and related  No family history on file.  Social History Social History  Substance Use Topics  . Smoking status: Never Smoker   . Smokeless tobacco: None  . Alcohol Use: No    Review of Systems Constitutional: No fever/chills Eyes: No visual changes. ENT: No sore throat. Cardiovascular: Deniesies shortness of breath. Gastrointestinal: No abdominal pain.  No nausea, no vomiting.  No diarrhea.  No constipation. Genitourinary: Negative for dysuria. Musculoskeletal: Negative for back pain. Skin: Negative for rash. Neurological: Negative for headaches, focal weakness or numbness.  10-point ROS otherwise negative.  ____________________________________________   PHYSICAL EXAM:  VITAL SIGNS: ED Triage Vitals  Enc Vitals Group     BP 03/24/15 1306 171/69 mmHg     Pulse Rate 03/24/15 1306 78     Resp 03/24/15 1306 20     Temp 03/24/15 1306 98.2 F (36.8 C)     Temp Source 03/24/15 1306 Oral     SpO2  03/24/15 1306 100 %     Weight 03/24/15 1306 120 lb (54.432 kg)     Height 03/24/15 1306 5\' 5"  (1.651 m)     Head Cir --      Peak Flow --      Pain Score 03/24/15 1306 7     Pain Loc --      Pain Edu? --      Excl. in [place]? --     Constitutional: Alert.  Well appearing and in no acute distress. Eyes: Conjunctivae are normal. PERRL. EOMI. Head: Atraumatic. Nose: No congestion/rhinnorhea. Mouth/Throat: Mucous membranes are moist.  Oropharynx non-erythematous. Neck: No stridor.   Cardiovascular: Normal rate, regular rhythm. Grossly normal heart sounds.  Good peripheral circulation. Respiratory: Normal respiratory effort.  No retractions. Lungs CTAB. Gastrointestinal: Soft and nontender. No distention. No abdominal bruits. No CVA tenderness. Musculoskeletal: No lower extremity tenderness nor edema.  No joint  effusions. Neurologic:  Normal speech and language. No gross focal neurologic deficits are appreciated. No gait instability. Skin:  Skin is warm, dry and intact. No rash noted.   ____________________________________________   LABS (all labs ordered are listed, but only abnormal results are displayed)  Labs Reviewed  BASIC METABOLIC PANEL - Abnormal; Notable for the following:    Glucose, Bld 121 (*)    BUN 26 (*)    Creatinine, Ser 1.15 (*)    GFR calc non Af Amer 43 (*)    GFR calc Af Amer 50 (*)    All other components within normal limits  CBC - Abnormal; Notable for the following:    WBC 14.4 (*)    RDW 14.8 (*)    All other components within normal limits  TROPONIN I - Abnormal; Notable for the following:    Troponin I 0.07 (*)    All other components within normal limits  HEPATIC FUNCTION PANEL - Abnormal; Notable for the following:    Total Bilirubin 1.8 (*)    Indirect Bilirubin 1.4 (*)    All other components within normal limits  BRAIN NATRIURETIC PEPTIDE  URINALYSIS COMPLETEWITH MICROSCOPIC ([facility] ONLY)   ____________________________________________  EKG  KG read and interpreted by me normal sinus rhythm at a rate of 76 normal axis no acute changes ____________________________________________  RADIOLOGY  Chest x-ray read by radiology course interstitial markings otherwise ____________________________________________   PROCEDURES    ____________________________________________   INITIAL IMPRESSION / ASSESSMENT AND PLAN / ED COURSE  Pertinent labs & imaging results that were available during my care of the patient were reviewed by me and considered in my medical decision making (see chart for details).   ____________________________________________   FINAL CLINICAL IMPRESSION(S) / ED DIAGNOSES  Final diagnoses:  Chest pain, unspecified chest pain type  Elevated troponin  Elevated white blood cell count  Confusion      Nena Polio,  MD 03/24/15 1928

## 2015-03-24 NOTE — H&P (Signed)
[facility] [place] NAME: Karlen Umbarger    MR#:  [id number]  DATE OF BIRTH:  1929-10-27  DATE OF ADMISSION:  03/24/2015  PRIMARY CARE PHYSICIAN: No primary care provider on file.   REQUESTING/REFERRING PHYSICIAN: Dr Laveda Norman  CHIEF COMPLAINT:  Chest pain today  HISTORY OF PRESENT ILLNESS:  Claresa Canale  is a 81 y.o. female with a known history of Alzheimer's dementia, hypertension, hypothyroidism comes to the emergency room accompanied by son and daughter will give most of the history. Patient has dementia unable to give much history and review of system. She appears to be confused. Patient's daughter reports according to the first mom's sitter patient complained of some chest pain which is which was mid substernal radiated to the left on this morning. She was not feeling well and was brought to the emergency room and was found to have a troponin of 0.07 and mild UTI. Family seems to feel patient is more confused this past week. Patient due to her dementia is not able to answer many questions. She did have some chest pain. Denies any abdominal pain or acid reflux. She is being admitted for further eval shut management.  PAST MEDICAL HISTORY:   Past Medical History  Diagnosis Date  . Thyroid disease   . Cancer ([place])   . GERD (gastroesophageal reflux disease)   . Hypertension   . Stenosis of carotid artery     PAST SURGICAL HISTOIRY:   Past Surgical History  Procedure Laterality Date  . Colon surgery    . Hip surgery    . Endarterectomy      SOCIAL HISTORY:   Social History  Substance Use Topics  . Smoking status: Never Smoker   . Smokeless tobacco: Not on file  . Alcohol Use: No    FAMILY HISTORY:  No family history on file.  DRUG ALLERGIES:   Allergies  Allergen Reactions  . Cod [Fish Allergy] Nausea And Vomiting  . Codeine Nausea And Vomiting and Rash  . Morphine And Related Nausea And Vomiting,  Rash and Other (See Comments)    Reaction:  Hallucinations     REVIEW OF SYSTEMS:  Review of Systems  Unable to perform ROS: dementia     MEDICATIONS AT HOME:   Prior to Admission medications   Medication Sig Start Date End Date Taking? Authorizing Provider  aspirin EC 81 MG tablet Take 81 mg by mouth daily.   Yes Historical Provider, MD  Calcium Carb-Cholecalciferol (CALCIUM 600+D) 600-800 MG-UNIT TABS Take 1 tablet by mouth 2 (two) times daily.   Yes Historical Provider, MD  donepezil (ARICEPT) 5 MG tablet Take 5 mg by mouth daily.    Yes Historical Provider, MD  ibuprofen (ADVIL,MOTRIN) 200 MG tablet Take 400 mg by mouth every evening.   Yes Historical Provider, MD  lansoprazole (PREVACID) 30 MG capsule Take 30 mg by mouth daily.    Yes Historical Provider, MD  levothyroxine (SYNTHROID, LEVOTHROID) 100 MCG tablet Take 100 mcg by mouth daily before breakfast.   Yes Historical Provider, MD  maprotiline (LUDIOMIL) 25 MG tablet Take 75 mg by mouth daily.   Yes Historical Provider, MD  Multiple Vitamin (MULTIVITAMIN WITH MINERALS) TABS tablet Take 1 tablet by mouth every evening.   Yes Historical Provider, MD  Omega-3 Fatty Acids (FISH OIL) 1000 MG CAPS Take 1,000 mg by mouth daily.   Yes Historical Provider, MD  telmisartan (MICARDIS) 80 MG tablet Take 80 mg by mouth  daily.   Yes Historical Provider, MD  vitamin B-12 (CYANOCOBALAMIN) 1000 MCG tablet Take 1,000 mcg by mouth daily.   Yes Historical Provider, MD  vitamin E (VITAMIN E) 400 UNIT capsule Take 400 Units by mouth daily.   Yes Historical Provider, MD      VITAL SIGNS:  Blood pressure 209/88, pulse 87, temperature 98.2 F (36.8 C), temperature source Oral, resp. rate 17, height 5\' 5"  (1.651 m), weight 54.432 kg (120 lb), SpO2 99 %.  PHYSICAL EXAMINATION:  GENERAL:  81 y.o.-year-old patient lying in the bed with no acute distress.  EYES: Pupils equal, round, reactive to light and accommodation. No scleral icterus. Extraocular  muscles intact.  HEENT: Head atraumatic, normocephalic. Oropharynx and nasopharynx clear.  NECK:  Supple, no jugular venous distention. No thyroid enlargement, no tenderness.  LUNGS: Normal breath sounds bilaterally, no wheezing, rales,rhonchi or crepitation. No use of accessory muscles of respiration.  CARDIOVASCULAR: S1, S2 normal. No murmurs, rubs, or gallops.  ABDOMEN: Soft, nontender, nondistended. Bowel sounds present. No organomegaly or mass.  EXTREMITIES: No pedal edema, cyanosis, or clubbing.  NEUROLOGIC: Grossly intact. Unable to assess secondary to patient's dementia  PSYCHIATRIC: The patient is alert and oriented x 3.  SKIN: No obvious rash, lesion, or ulcer.   LABORATORY PANEL:   CBC  Recent Labs Lab 03/24/15 1744  WBC 14.4*  HGB 14.2  HCT 44.3  PLT 212   ------------------------------------------------------------------------------------------------------------------  Chemistries   Recent Labs Lab 03/24/15 1744  NA 139  K 4.3  CL 105  CO2 27  GLUCOSE 121*  BUN 26*  CREATININE 1.15*  CALCIUM 9.5  AST 29  ALT 27  ALKPHOS 86  BILITOT 1.8*   ------------------------------------------------------------------------------------------------------------------  Cardiac Enzymes  Recent Labs Lab 03/24/15 1744  TROPONINI 0.07*   ------------------------------------------------------------------------------------------------------------------  RADIOLOGY:  Dg Chest 2 View  03/24/2015  CLINICAL DATA:  Patient was brought to ED because her son found her this morning sitting in her floor at home saying that her chest hurt. Patient has a HX of cancer, HTN, and thyroid disease. EXAM: CHEST - 2 VIEW COMPARISON:  11/22/2013 FINDINGS: Coarse interstitial markings throughout both lungs. No confluent airspace infiltrate or overt edema. Heart size normal. Mildly tortuous atheromatous aorta. No effusion.  No pneumothorax. Severe T7 thoracic compression fracture deformity,  age indeterminate. Progressive compression fracture deformity of T12 compared to the prior lumbar films of 05/02/2010. IMPRESSION: 1. Coarse interstitial markings without acute or superimposed abnormality. 2. Severe T7 and progressive T12 compression fracture deformities Electronically Signed   By: Lucrezia Europe M.D.   On: 03/24/2015 17:31   Ct Head Wo Contrast  03/24/2015  ADDENDUM REPORT: 03/24/2015 17:00 ADDENDUM: Study discussed by telephone with Dr. Conni Slipper on 03/24/2015 at 1653 hours. Electronically Signed   By: Genevie Ann M.D.   On: 03/24/2015 17:00  03/24/2015  CLINICAL DATA:  81 year old female found sitting on floor with altered mental status today. Initial encounter. EXAM: CT HEAD WITHOUT CONTRAST TECHNIQUE: Contiguous axial images were obtained from the base of the skull through the vertex without intravenous contrast. COMPARISON:  CTA neck 07/19/2011. FINDINGS: Visualized paranasal sinuses and mastoids are clear. Osteopenia. No acute osseous abnormality identified. Postoperative changes to the right globe. No acute orbit or scalp soft tissue changes. Calcified atherosclerosis at the skull base. Cerebral volume is within normal limits for age. Mild dural calcifications. No midline shift, mass effect, or evidence of intracranial mass lesion. No ventriculomegaly. Normal for age gray-white matter differentiation. No cortically based acute infarct  identified. No acute intracranial hemorrhage identified. Mild asymmetric hyperdensity of the left ICA terminus and M1 segment. IMPRESSION: Largely unremarkable for age noncontrast CT appearance of the head. Mild asymmetric density at the left ICA terminus and M1, likely due to atherosclerosis in the absence of right side weakness. Electronically Signed: By: Genevie Ann M.D. On: 03/24/2015 16:50    EKG:  Normal sinus rhythm LVH. No acute ST elevation or depression  IMPRESSION AND PLAN:   Sharyn Lull is 81 year old Caucasian female with history of them is  dementia, hypertension comes in the emergency room with  1. Chest pain with borderline elevated troponin with mild renal insufficiency. Patient due to her dementia wasn't able to be more detailed on her chest pain. She has no other risk factor other than hypertension. Will admit her to telemetry floor Enzymes 3 Cardiac consultation if and troponin continues to rise Aspirin, by mouth beta blockers  2. Asymptomatic UTI with elevated WBC count Treatment with Cipro 500 twice a day for 3 days  3. Renal insufficiency with mild dehydration IV fluids  4. DVT prophylaxis subcutaneous Lovenox  5. Management for discharge planning    All the records are reviewed and case discussed with ED provider. Management plans discussed with the patient, family and they are in agreement.  CODE STATUS: DO NOT RESUSCITATE. This was discussed with patient's son and daughter    TOTAL TIME TAKING CARE OF THIS PATIENT: 50 minutes.    Elisavet Buehrer M.D on 03/24/2015 at 9:19 PM  Between 7am to 6pm - Pager - [phone]  After 6pm go to [email or web address] - password [email or web address] [facility]  [facility]  Office  [phone]  CC: Primary care physician; No primary care provider on file.

## 2015-03-24 NOTE — ED Notes (Signed)
Patient transported to X-ray 

## 2015-03-24 NOTE — ED Notes (Signed)
Son states he went to his mom house this am , found her sitting on floor, she said her chest was hurting

## 2015-03-25 ENCOUNTER — Observation Stay (HOSPITAL_BASED_OUTPATIENT_CLINIC_OR_DEPARTMENT_OTHER)
Admit: 2015-03-25 | Discharge: 2015-03-25 | Disposition: A | Discharge status: Home / Self Care | Payer: Medicare Other | Attending: Internal Medicine | Admitting: Internal Medicine

## 2015-03-25 ENCOUNTER — Encounter: Payer: Self-pay | Admitting: Physician Assistant

## 2015-03-25 DIAGNOSIS — I10 Essential (primary) hypertension: Secondary | ICD-10-CM

## 2015-03-25 DIAGNOSIS — I340 Nonrheumatic mitral (valve) insufficiency: Secondary | ICD-10-CM | POA: Diagnosis not present

## 2015-03-25 DIAGNOSIS — I214 Non-ST elevation (NSTEMI) myocardial infarction: Principal | ICD-10-CM

## 2015-03-25 LAB — LIPID PANEL
Cholesterol: 175 mg/dL (ref 0–200)
HDL: 52 mg/dL (ref >40)
LDL Cholesterol: 115 mg/dL — ABNORMAL HIGH (ref 0–99)
Total CHOL/HDL Ratio: 3.4 RATIO
Triglycerides: 42 mg/dL (ref <150)
VLDL: 8 mg/dL (ref 0–40)

## 2015-03-25 LAB — BASIC METABOLIC PANEL
ANION GAP: 7 (ref 5–15)
BUN: 24 mg/dL — ABNORMAL HIGH (ref 6–20)
CHLORIDE: 108 mmol/L (ref 101–111)
CO2: 27 mmol/L (ref 22–32)
Calcium: 9.3 mg/dL (ref 8.9–10.3)
Creatinine, Ser: 1.22 mg/dL — ABNORMAL HIGH (ref 0.44–1.00)
GFR calc non Af Amer: 40 mL/min — ABNORMAL LOW (ref >60)
GFR, EST AFRICAN AMERICAN: 46 mL/min — AB (ref >60)
Glucose, Bld: 111 mg/dL — ABNORMAL HIGH (ref 65–99)
POTASSIUM: 4 mmol/L (ref 3.5–5.1)
Sodium: 142 mmol/L (ref 135–145)

## 2015-03-25 LAB — TROPONIN I
TROPONIN I: 0.08 ng/mL — AB (ref <0.031)
TROPONIN I: 0.03 ng/mL (ref <0.031)
TROPONIN I: 0.06 ng/mL — AB (ref <0.031)
Troponin I: 0.11 ng/mL — ABNORMAL HIGH (ref <0.031)

## 2015-03-25 MED ORDER — HEPARIN BOLUS VIA INFUSION
3500.0000 [IU] | Freq: Once | INTRAVENOUS | Status: AC
  Administered 2015-03-25: 3500 [IU] via INTRAVENOUS
  Filled 2015-03-25: qty 3500

## 2015-03-25 MED ORDER — HEPARIN (PORCINE) IN NACL 100-0.45 UNIT/ML-% IJ SOLN
700.0000 [IU]/h | INTRAMUSCULAR | Status: DC
  Administered 2015-03-25: 700 [IU]/h via INTRAVENOUS
  Filled 2015-03-25 (×3): qty 250

## 2015-03-25 MED ORDER — AMLODIPINE BESYLATE 10 MG PO TABS
10.0000 mg | ORAL_TABLET | Freq: Two times a day (BID) | ORAL | Status: DC
  Administered 2015-03-25 – 2015-03-27 (×4): 10 mg via ORAL
  Filled 2015-03-25 (×4): qty 1

## 2015-03-25 MED ORDER — LISINOPRIL 5 MG PO TABS
5.0000 mg | ORAL_TABLET | Freq: Every day | ORAL | Status: DC
  Administered 2015-03-25: 5 mg via ORAL
  Filled 2015-03-25: qty 1

## 2015-03-25 MED ORDER — AMLODIPINE BESYLATE 5 MG PO TABS
5.0000 mg | ORAL_TABLET | Freq: Two times a day (BID) | ORAL | Status: DC
  Administered 2015-03-25: 5 mg via ORAL
  Filled 2015-03-25: qty 1

## 2015-03-25 MED ORDER — PANTOPRAZOLE SODIUM 40 MG PO TBEC
40.0000 mg | DELAYED_RELEASE_TABLET | Freq: Every day | ORAL | Status: DC
  Administered 2015-03-25 – 2015-03-27 (×3): 40 mg via ORAL
  Filled 2015-03-25 (×2): qty 1

## 2015-03-25 NOTE — Care Management (Signed)
patient admitted under observation for chest pain.  She presents from home and has significant dementia.  Patient has a caregiver during the day and her son checks on her at night.  Prior to work the morning of 11/15, he found her sitting in the kitchen floor complaining of chest pain.  Troponoins are mildly elevated.  At present there is no cardiology consult ordered.  Patient is DNR.

## 2015-03-25 NOTE — Care Management Obs Status (Signed)
[facility] NOTIFICATION   Patient Details  Name: Rachel Rush MRN: [id number] Date of Birth: 03-Jan-1930   Medicare Observation Status Notification Given:  Yes  Patient admitted under observation.  Presented and explained observation notice.  Son signed.  patient has dementia.  Original given to patient and copy placed in medical record.  Copy manually delivered to HIM     Katrina Stack, RN 03/25/2015, 9:53 AM

## 2015-03-25 NOTE — Progress Notes (Signed)
ANTICOAGULATION CONSULT NOTE - Initial Consult  Pharmacy Consult for Heparin Indication: chest pain/ACS  Allergies  Allergen Reactions  . Cod [Fish Allergy] Nausea And Vomiting  . Codeine Nausea And Vomiting and Rash  . Morphine And Related Nausea And Vomiting, Rash and Other (See Comments)    Reaction:  Hallucinations     Patient Measurements: Height: 5\' 7"  (170.2 cm) (stated) Weight: 129 lb 8 oz (58.741 kg) (admission) IBW/kg (Calculated) : 61.6 Heparin Dosing Weight: 58.7kg  Vital Signs: Temp: 98.2 F (36.8 C) (11/16 1112) Temp Source: Oral (11/16 1112) BP: 167/56 mmHg (11/16 1112) Pulse Rate: 70 (11/16 1112)  Labs:  Recent Labs  03/24/15 1744 03/25/15 0049 03/25/15 0408 03/25/15 0945  HGB 14.2  --   --   --   HCT 44.3  --   --   --   PLT 212  --   --   --   CREATININE 1.15*  --  1.22*  --   TROPONINI 0.07* 0.03 0.06* 0.11*    Estimated Creatinine Clearance: 32.4 mL/min (by C-G formula based on Cr of 1.22).   Medical History: Past Medical History  Diagnosis Date  . Hypothyroidism   . Cancer ([place])   . GERD (gastroesophageal reflux disease)   . Hypertension   . Stenosis of carotid artery     a. s/p bilateral carotid enarterectomy  . Alzheimer's dementia   . Colostomy in place [facility])     a. since age 81  . HLD (hyperlipidemia)     Medications:  Scheduled:  . amLODipine  10 mg Oral BID  . aspirin EC  81 mg Oral Daily  . calcium-vitamin D  1 tablet Oral BID  . donepezil  5 mg Oral Daily  . heparin  3,500 Units Intravenous Once  . ibuprofen  400 mg Oral QPM  . levothyroxine  100 mcg Oral QAC breakfast  . lisinopril  5 mg Oral Daily  . maprotiline  75 mg Oral Daily  . metoprolol tartrate  25 mg Oral BID  . multivitamin with minerals  1 tablet Oral QPM  . omega-3 acid ethyl esters  1 g Oral Daily  . pantoprazole  40 mg Oral Daily  . vitamin B-12  1,000 mcg Oral Daily  . vitamin E  400 Units Oral Daily    Assessment: Rachel Rush is an 81 yo female  admitted for chest pain found to have an NSTEMI. Pharmacy consulted to dose heparin.  Goal of Therapy:  Heparin level 0.3-0.7 units/ml Monitor platelets by anticoagulation protocol: Yes   Plan:  Give 3500 units bolus x 1 Start heparin infusion at 700 units/hr Check anti-Xa level in 8 hours and daily while on heparin Continue to monitor H&H and platelets   Check heparin level at 2330, assuming 1530 start time.  Pharmacy will continue to follow.  Vena Rua 03/25/2015,3:18 PM

## 2015-03-25 NOTE — Care Management (Signed)
Spoke with patient's son Danise Riddley.  He confirms that there is a caregiver present during the day.  Patient is alone at night.  Says he lives 20 years from the patient's residence.  He states "I guess I am going to have to start sleeping over again."  Discussed that patient is admitted under observation and do anticipate discharge within 24 hours.  Mr Eckman verbalizes that it is time to do something but does not know how to begin.  He says all of the patient's money has been spent.  He is not sure of monthly income but his wife Santiago Glad takes care of finances.  Family has made "contact" with Myrtha Mantis who "runs some homes"  but no plans have been discussed for admitting patient to memory care home.   Discussed that assisted living memory care facilities are not covered by Medicare.  The care that is being provided for his mother consists of supervision, assist with bathing, feeding dressing, walking with her.  Discussed that this  appears to be "custodial" care needs.  He denies that there are new functional limitations but does allow that "she wanders."  Discussed the need to initiate medicaid application.  If he feels that patient needs to go to facility directly from hospital, (and does not meet skilled criteria), then must be willing to accept financial liability until medicaid is approved.  Discussed if patient returns home initially, would need round the clock supervision and home health services could be arranged- SN, and social work.  Spoke with representative from [employer] today and this agency can offer some dementia focused support for this family.  Discussed case with clinical Education officer, museum and attending.

## 2015-03-25 NOTE — Progress Notes (Signed)
[facility] [place] NAME: Rachel Rush    MR#:  [id number]  DATE OF BIRTH:  January 10, 1930  SUBJECTIVE:  CHIEF COMPLAINT:   Chief Complaint  Patient presents with  . Chest Pain   Patient is 81 year old Caucasian female with past medical history significant for history of hypertension, Alzheimer's dementia, hypothyroidism who presents to the hospital with complaints of chest pains. On arrival to the hospital patient was noted to have elevated blood pressure as well as elevated troponin. Apparently patient was also more confused over the past one week, and requiring quite significant family help, for which patient's family is requesting placement. Patient denies any chest pains at present. High blood pressure has improved, but not significantly. Family was surprised about blood pressure readings, apparently in the past. They were told that her blood pressure readings were fine, patient is on blood pressure medications Review of Systems  Unable to perform ROS: dementia   denies any pain  VITAL SIGNS: Blood pressure 167/56, pulse 70, temperature 98.2 F (36.8 C), temperature source Oral, resp. rate 18, height 5\' 7"  (1.702 m), weight 58.741 kg (129 lb 8 oz), SpO2 98 %.  PHYSICAL EXAMINATION:   GENERAL:  81 y.o.-year-old patient lying in the bed with no acute distress.  EYES: Pupils equal, round, reactive to light and accommodation. No scleral icterus. Extraocular muscles intact.  HEENT: Head atraumatic, normocephalic. Oropharynx and nasopharynx clear.  NECK:  Supple, no jugular venous distention. No thyroid enlargement, no tenderness.  LUNGS: Normal breath sounds bilaterally, no wheezing, rales,rhonchi or crepitation. No use of accessory muscles of respiration.  CARDIOVASCULAR: S1, S2 normal. No murmurs, rubs, or gallops.  ABDOMEN: Soft, nontender, nondistended. Bowel sounds present. No organomegaly or mass.  EXTREMITIES: No pedal edema,  cyanosis, or clubbing.  NEUROLOGIC: Cranial nerves II through XII are intact. Muscle strength 5/5 in all extremities. Sensation intact. Gait not checked.  PSYCHIATRIC: The patient is alert and oriented x 3.  SKIN: No obvious rash, lesion, or ulcer.   ORDERS/RESULTS REVIEWED:   CBC  Recent Labs Lab 03/24/15 1744  WBC 14.4*  HGB 14.2  HCT 44.3  PLT 212  MCV 95.4  MCH 30.5  MCHC 32.0  RDW 14.8*   ------------------------------------------------------------------------------------------------------------------  Chemistries   Recent Labs Lab 03/24/15 1744 03/25/15 0408  NA 139 142  K 4.3 4.0  CL 105 108  CO2 27 27  GLUCOSE 121* 111*  BUN 26* 24*  CREATININE 1.15* 1.22*  CALCIUM 9.5 9.3  AST 29  --   ALT 27  --   ALKPHOS 86  --   BILITOT 1.8*  --    ------------------------------------------------------------------------------------------------------------------ estimated creatinine clearance is 32.4 mL/min (by C-G formula based on Cr of 1.22). ------------------------------------------------------------------------------------------------------------------ No results for input(s): TSH, T4TOTAL, T3FREE, THYROIDAB in the last 72 hours.  Invalid input(s): FREET3  Cardiac Enzymes  Recent Labs Lab 03/25/15 0049 03/25/15 0408 03/25/15 0945  TROPONINI 0.03 0.06* 0.11*   ------------------------------------------------------------------------------------------------------------------ Invalid input(s): POCBNP ---------------------------------------------------------------------------------------------------------------  RADIOLOGY: Dg Chest 2 View  03/24/2015  CLINICAL DATA:  Patient was brought to ED because her son found her this morning sitting in her floor at home saying that her chest hurt. Patient has a HX of cancer, HTN, and thyroid disease. EXAM: CHEST - 2 VIEW COMPARISON:  11/22/2013 FINDINGS: Coarse interstitial markings throughout both lungs. No confluent  airspace infiltrate or overt edema. Heart size normal. Mildly tortuous atheromatous aorta. No effusion.  No pneumothorax. Severe T7 thoracic compression fracture  deformity, age indeterminate. Progressive compression fracture deformity of T12 compared to the prior lumbar films of 05/02/2010. IMPRESSION: 1. Coarse interstitial markings without acute or superimposed abnormality. 2. Severe T7 and progressive T12 compression fracture deformities Electronically Signed   By: Lucrezia Europe M.D.   On: 03/24/2015 17:31   Ct Head Wo Contrast  03/24/2015  ADDENDUM REPORT: 03/24/2015 17:00 ADDENDUM: Study discussed by telephone with Dr. Conni Slipper on 03/24/2015 at 1653 hours. Electronically Signed   By: Genevie Ann M.D.   On: 03/24/2015 17:00  03/24/2015  CLINICAL DATA:  81 year old female found sitting on floor with altered mental status today. Initial encounter. EXAM: CT HEAD WITHOUT CONTRAST TECHNIQUE: Contiguous axial images were obtained from the base of the skull through the vertex without intravenous contrast. COMPARISON:  CTA neck 07/19/2011. FINDINGS: Visualized paranasal sinuses and mastoids are clear. Osteopenia. No acute osseous abnormality identified. Postoperative changes to the right globe. No acute orbit or scalp soft tissue changes. Calcified atherosclerosis at the skull base. Cerebral volume is within normal limits for age. Mild dural calcifications. No midline shift, mass effect, or evidence of intracranial mass lesion. No ventriculomegaly. Normal for age gray-white matter differentiation. No cortically based acute infarct identified. No acute intracranial hemorrhage identified. Mild asymmetric hyperdensity of the left ICA terminus and M1 segment. IMPRESSION: Largely unremarkable for age noncontrast CT appearance of the head. Mild asymmetric density at the left ICA terminus and M1, likely due to atherosclerosis in the absence of right side weakness. Electronically Signed: By: Genevie Ann M.D. On: 03/24/2015 16:50     EKG:  Orders placed or performed during the hospital encounter of 03/24/15  . EKG 12-Lead  . EKG 12-Lead  . ED EKG within 10 minutes  . ED EKG within 10 minutes    ASSESSMENT AND PLAN:  Active Problems:   Chest pain 1. Chest pain, unclear etiology, possibly related to hypertension, however, cannot rule out underlying coronary artery disease, continue aspirin, metoprolol, nitroglycerin topically as well as heparin intravenously per cardiologist's recommendations, getting echocardiogram and cardiology consultation 2. Malignant essential hypertension, as above, continue metoprolol as well as nitroglycerin topically, advanced Norvasc and added lisinopril per cardiologist, following blood pressure readings and make decisions about advancement medications if needed 3. Renal insufficiency. Follow with therapy 4. Leukocytosis, etiology could be stress, discontinue antibiotics and follow white blood cell count 5. Abnormal urinalysis with no pyuria initially thought to be urinary tract infection, urine cultures are taken. Patient is afebrile, will hold off antibiotics for now unless cultures are positive   Management plans discussed with the patient, family and they are in agreement.   DRUG ALLERGIES:  Allergies  Allergen Reactions  . Cod [Fish Allergy] Nausea And Vomiting  . Codeine Nausea And Vomiting and Rash  . Morphine And Related Nausea And Vomiting, Rash and Other (See Comments)    Reaction:  Hallucinations     CODE STATUS:     Code Status Orders        Start     Ordered   03/24/15 2201  Do not attempt resuscitation (DNR)   Continuous    Question Answer Comment  In the event of cardiac or respiratory ARREST Do not call a "code blue"   In the event of cardiac or respiratory ARREST Do not perform Intubation, CPR, defibrillation or ACLS   In the event of cardiac or respiratory ARREST Use medication by any route, position, wound care, and other measures to relive pain and  suffering. May use oxygen,  suction and manual treatment of airway obstruction as needed for comfort.      03/24/15 2200    Advance Directive Documentation        Most Recent Value   Type of Advance Directive  Healthcare Power of Attorney   Pre-existing out of facility DNR order (yellow form or pink MOST form)     "MOST" Form in Place?        TOTAL TIME TAKING CARE OF THIS PATIENT: 60 minutes.  Coordination of care time 30 minutes on discussion with patient's family  Haadi Santellan M.D on 03/25/2015 at 3:36 PM  Between 7am to 6pm - Pager - [phone]  After 6pm go to [email or web address] - password [email or web address] [facility]  [facility]  Office  [phone]  CC: Primary care physician; No primary care provider on file.

## 2015-03-25 NOTE — Consult Note (Signed)
Cardiology Consultation Note  Patient ID: Rachel Rush, MRN: [id number], DOB/AGE: 1929/10/24 81 y.o. Admit date: 03/24/2015   Date of Consult: 03/25/2015 Primary Physician: No primary care provider on file. Primary Cardiologist: New to [facility]  Chief Complaint: Chest pain Reason for Consult: Elevated troponin  HPI: 81 y.o. female with h/o Alzheimer's dementia, HTN, HLD, carotid artery stenosis s/p bilateral carotid endarterectomy, prior left total hip replacement s/p subsequent fracture treated conservatively in 2014, chronic colostomy since age 25, and hypothyroidism who presented to [facility] on 11/15 with chest pain.  She has no previously known cardiac history. Her sone typically watches her in the evenings until she falls asleep, then comes in first thing in the morning to check on her and make sure she is doing ok. This is followed by an at home sitter for the remainder of the day. On 11/15 her at home sitter found her sitting on the floor upon her arrival sitting behind her walker. Her son was called and told about this so he came home. Upon his arrival to the house the patient stated she was having some chest pain. She has never told the family she was having chest pain previously. Upon telling her son she was having chest pain she went back towards to room to sleep. Per the sitter the patient told her the pain radiated towards her left shoulder and arm, though she denied this to her son. There has been no associated SOB, diaphoresis, nausea, vomiting, palpitations, presyncope, or syncope. Chest pain lasted for about an 1 hour before self resolving.      Upon the patient's arrival to [facility] they were found to have troponin of 0.03-->0.06-->0.11, WBC 11.6-->14.4, SCr 1.32-->1.22, BNP 98. ECG showed NSR, 76 bpm, left axis deviation, LVH, no significant st/t changes, CXR showed coarse interstitial marking without acute abnormality. CT head was largely unremarkable. Echo is pending. She is currently  asymptomatic. History is taken from patient's son.      Past Medical History  Diagnosis Date  . Hypothyroidism   . Cancer ([place])   . GERD (gastroesophageal reflux disease)   . Hypertension   . Stenosis of carotid artery     a. s/p bilateral carotid enarterectomy  . Alzheimer's dementia   . Colostomy in place [facility])     a. since age 76  . HLD (hyperlipidemia)       Most Recent Cardiac Studies: Echo pending   Surgical History:  Past Surgical History  Procedure Laterality Date  . Colon surgery    . Hip surgery    . Endarterectomy       Home Meds: Prior to Admission medications   Medication Sig Start Date End Date Taking? Authorizing Provider  aspirin EC 81 MG tablet Take 81 mg by mouth daily.   Yes Historical Provider, MD  Calcium Carb-Cholecalciferol (CALCIUM 600+D) 600-800 MG-UNIT TABS Take 1 tablet by mouth 2 (two) times daily.   Yes Historical Provider, MD  donepezil (ARICEPT) 5 MG tablet Take 5 mg by mouth daily.    Yes Historical Provider, MD  ibuprofen (ADVIL,MOTRIN) 200 MG tablet Take 400 mg by mouth every evening.   Yes Historical Provider, MD  lansoprazole (PREVACID) 30 MG capsule Take 30 mg by mouth daily.    Yes Historical Provider, MD  levothyroxine (SYNTHROID, LEVOTHROID) 100 MCG tablet Take 100 mcg by mouth daily before breakfast.   Yes Historical Provider, MD  maprotiline (LUDIOMIL) 25 MG tablet Take 75 mg by mouth daily.   Yes Historical  Provider, MD  Multiple Vitamin (MULTIVITAMIN WITH MINERALS) TABS tablet Take 1 tablet by mouth every evening.   Yes Historical Provider, MD  Omega-3 Fatty Acids (FISH OIL) 1000 MG CAPS Take 1,000 mg by mouth daily.   Yes Historical Provider, MD  telmisartan (MICARDIS) 80 MG tablet Take 80 mg by mouth daily.   Yes Historical Provider, MD  vitamin B-12 (CYANOCOBALAMIN) 1000 MCG tablet Take 1,000 mcg by mouth daily.   Yes Historical Provider, MD  vitamin E (VITAMIN E) 400 UNIT capsule Take 400 Units by mouth daily.   Yes Historical  Provider, MD    Inpatient Medications:  . amLODipine  5 mg Oral BID  . aspirin EC  81 mg Oral Daily  . calcium-vitamin D  1 tablet Oral BID  . ciprofloxacin  500 mg Oral BID  . donepezil  5 mg Oral Daily  . enoxaparin (LOVENOX) injection  40 mg Subcutaneous Q24H  . ibuprofen  400 mg Oral QPM  . levothyroxine  100 mcg Oral QAC breakfast  . maprotiline  75 mg Oral Daily  . metoprolol tartrate  25 mg Oral BID  . multivitamin with minerals  1 tablet Oral QPM  . omega-3 acid ethyl esters  1 g Oral Daily  . pantoprazole  40 mg Oral Daily  . vitamin B-12  1,000 mcg Oral Daily  . vitamin E  400 Units Oral Daily      Allergies:  Allergies  Allergen Reactions  . Cod [Fish Allergy] Nausea And Vomiting  . Codeine Nausea And Vomiting and Rash  . Morphine And Related Nausea And Vomiting, Rash and Other (See Comments)    Reaction:  Hallucinations     Social History   Social History  . Marital Status: Widowed    Spouse Name: N/A  . Number of Children: N/A  . Years of Education: N/A   Occupational History  . Not on file.   Social History Main Topics  . Smoking status: Never Smoker   . Smokeless tobacco: Not on file  . Alcohol Use: No  . Drug Use: Not on file  . Sexual Activity: Not on file   Other Topics Concern  . Not on file   Social History Narrative     Family History  Problem Relation Age of Onset  . CAD Mother   . Diabetes Mellitus II Mother   . Alcoholism Father   . Prostate cancer Father      Review of Systems: Review of Systems  Unable to perform ROS: dementia    Labs:  Recent Labs  03/24/15 1744 03/25/15 0049 03/25/15 0408 03/25/15 0945  TROPONINI 0.07* 0.03 0.06* 0.11*   Lab Results  Component Value Date   WBC 14.4* 03/24/2015   HGB 14.2 03/24/2015   HCT 44.3 03/24/2015   MCV 95.4 03/24/2015   PLT 212 03/24/2015     Recent Labs Lab 03/24/15 1744 03/25/15 0408  NA 139 142  K 4.3 4.0  CL 105 108  CO2 27 27  BUN 26* 24*    CREATININE 1.15* 1.22*  CALCIUM 9.5 9.3  PROT 7.5  --   BILITOT 1.8*  --   ALKPHOS 86  --   ALT 27  --   AST 29  --   GLUCOSE 121* 111*   No results found for: CHOL, HDL, LDLCALC, TRIG No results found for: DDIMER  Radiology/Studies:  Dg Chest 2 View  03/24/2015  CLINICAL DATA:  Patient was brought to ED because her son found  her this morning sitting in her floor at home saying that her chest hurt. Patient has a HX of cancer, HTN, and thyroid disease. EXAM: CHEST - 2 VIEW COMPARISON:  11/22/2013 FINDINGS: Coarse interstitial markings throughout both lungs. No confluent airspace infiltrate or overt edema. Heart size normal. Mildly tortuous atheromatous aorta. No effusion.  No pneumothorax. Severe T7 thoracic compression fracture deformity, age indeterminate. Progressive compression fracture deformity of T12 compared to the prior lumbar films of 05/02/2010. IMPRESSION: 1. Coarse interstitial markings without acute or superimposed abnormality. 2. Severe T7 and progressive T12 compression fracture deformities Electronically Signed   By: Lucrezia Europe M.D.   On: 03/24/2015 17:31   Ct Head Wo Contrast  03/24/2015  ADDENDUM REPORT: 03/24/2015 17:00 ADDENDUM: Study discussed by telephone with Dr. Conni Slipper on 03/24/2015 at 1653 hours. Electronically Signed   By: Genevie Ann M.D.   On: 03/24/2015 17:00  03/24/2015  CLINICAL DATA:  81 year old female found sitting on floor with altered mental status today. Initial encounter. EXAM: CT HEAD WITHOUT CONTRAST TECHNIQUE: Contiguous axial images were obtained from the base of the skull through the vertex without intravenous contrast. COMPARISON:  CTA neck 07/19/2011. FINDINGS: Visualized paranasal sinuses and mastoids are clear. Osteopenia. No acute osseous abnormality identified. Postoperative changes to the right globe. No acute orbit or scalp soft tissue changes. Calcified atherosclerosis at the skull base. Cerebral volume is within normal limits for age. Mild  dural calcifications. No midline shift, mass effect, or evidence of intracranial mass lesion. No ventriculomegaly. Normal for age gray-white matter differentiation. No cortically based acute infarct identified. No acute intracranial hemorrhage identified. Mild asymmetric hyperdensity of the left ICA terminus and M1 segment. IMPRESSION: Largely unremarkable for age noncontrast CT appearance of the head. Mild asymmetric density at the left ICA terminus and M1, likely due to atherosclerosis in the absence of right side weakness. Electronically Signed: By: Genevie Ann M.D. On: 03/24/2015 16:50    EKG: NSR, 76 bpm, left axis deviation, LVH, no significant st/t changes   Weights: Filed Weights   03/24/15 1306 03/24/15 2201  Weight: 120 lb (54.432 kg) 129 lb 8 oz (58.741 kg)     Physical Exam: Blood pressure 167/56, pulse 70, temperature 98.2 F (36.8 C), temperature source Oral, resp. rate 18, height 5\' 7"  (1.702 m), weight 129 lb 8 oz (58.741 kg), SpO2 98 %. Body mass index is 20.28 kg/(m^2). General: Well developed, well nourished, in no acute distress. Head: Normocephalic, atraumatic, sclera non-icteric, no xanthomas, nares are without discharge.  Neck: Negative for carotid bruits. JVD not elevated. Lungs: Clear bilaterally to auscultation without wheezes, rales, or rhonchi. Breathing is unlabored. Heart: RRR with S1 S2. No murmurs, rubs, or gallops appreciated. Abdomen: Soft, non-tender, non-distended with normoactive bowel sounds. No hepatomegaly. No rebound/guarding. No obvious abdominal masses. Msk:  Strength and tone appear normal for age. Extremities: No clubbing or cyanosis. No edema.  Distal pedal pulses are 2+ and equal bilaterally. Neuro: Alert and oriented X 3. No facial asymmetry. No focal deficit. Moves all extremities spontaneously. Psych:  Pleasantly demented.     Assessment and Plan:   1. NSTEMI: -Start heparin gtt for 48 hours if treat medically (see below) -Check echo to  evaluate LV function, wall motion, and right-sided pressure -Unless there is a large anterior wall MI, would likely treat conservatively via medical route given patient's underlying severe Alzheimer's dementia. Patient's son is considering options at this time and wants to see echo before making a final decision  -Continue  to cycle troponin until peak and down trend -Continue Lopressor 25 mg bid -Check lipid and A1C -Would avoid both ibuprofen and aspirin usage   2. Accelerated HTN: -Improving -Increase Norvasc to 10 mg daily -Add lisinopril 5 mg daily -Lopressor as above -Per IM  3. Alzheimer's dementia: -Patient questions about multiple family members that have passed many years prior  -Per IM  4. Renal insufficiency: -Per IM  5. Dispo: -Patient would likely have a more peaceful out come in an assisted living facility equipped to manage her above dementia    Signed, Christell Faith, PA-C Pager: [phone] 03/25/2015, 2:49 PM

## 2015-03-26 DIAGNOSIS — D72829 Elevated white blood cell count, unspecified: Secondary | ICD-10-CM | POA: Diagnosis not present

## 2015-03-26 DIAGNOSIS — R410 Disorientation, unspecified: Secondary | ICD-10-CM | POA: Diagnosis not present

## 2015-03-26 DIAGNOSIS — R079 Chest pain, unspecified: Secondary | ICD-10-CM

## 2015-03-26 DIAGNOSIS — R7989 Other specified abnormal findings of blood chemistry: Secondary | ICD-10-CM

## 2015-03-26 DIAGNOSIS — R778 Other specified abnormalities of plasma proteins: Secondary | ICD-10-CM

## 2015-03-26 LAB — BASIC METABOLIC PANEL
ANION GAP: 6 (ref 5–15)
BUN: 30 mg/dL — ABNORMAL HIGH (ref 6–20)
CHLORIDE: 110 mmol/L (ref 101–111)
CO2: 25 mmol/L (ref 22–32)
Calcium: 9.4 mg/dL (ref 8.9–10.3)
Creatinine, Ser: 1.03 mg/dL — ABNORMAL HIGH (ref 0.44–1.00)
GFR calc Af Amer: 57 mL/min — ABNORMAL LOW (ref >60)
GFR, EST NON AFRICAN AMERICAN: 49 mL/min — AB (ref >60)
GLUCOSE: 127 mg/dL — AB (ref 65–99)
POTASSIUM: 4 mmol/L (ref 3.5–5.1)
SODIUM: 141 mmol/L (ref 135–145)

## 2015-03-26 LAB — TROPONIN I
Troponin I: 0.07 ng/mL — ABNORMAL HIGH (ref <0.031)
Troponin I: 0.07 ng/mL — ABNORMAL HIGH (ref <0.031)

## 2015-03-26 LAB — CBC
HCT: 42.3 % (ref 35.0–47.0)
HEMOGLOBIN: 13.6 g/dL (ref 12.0–16.0)
MCH: 30.7 pg (ref 26.0–34.0)
MCHC: 32.2 g/dL (ref 32.0–36.0)
MCV: 95.2 fL (ref 80.0–100.0)
Platelets: 200 10*3/uL (ref 150–440)
RBC: 4.44 MIL/uL (ref 3.80–5.20)
RDW: 14.6 % — ABNORMAL HIGH (ref 11.5–14.5)
WBC: 11.3 10*3/uL — AB (ref 3.6–11.0)

## 2015-03-26 LAB — HEPARIN LEVEL (UNFRACTIONATED)
Heparin Unfractionated: 0.36 IU/mL (ref 0.30–0.70)
Heparin Unfractionated: 0.42 IU/mL (ref 0.30–0.70)

## 2015-03-26 LAB — HEMOGLOBIN A1C: HEMOGLOBIN A1C: 5 % (ref 4.0–6.0)

## 2015-03-26 MED ORDER — LISINOPRIL 10 MG PO TABS
10.0000 mg | ORAL_TABLET | Freq: Every day | ORAL | Status: DC
  Administered 2015-03-26 – 2015-03-27 (×2): 10 mg via ORAL
  Filled 2015-03-26 (×2): qty 1

## 2015-03-26 MED ORDER — HALOPERIDOL LACTATE 5 MG/ML IJ SOLN
5.0000 mg | Freq: Once | INTRAMUSCULAR | Status: AC
  Administered 2015-03-26: 5 mg via INTRAVENOUS

## 2015-03-26 MED ORDER — SODIUM CHLORIDE 0.9 % IJ SOLN: 3.0000 mL | INTRAMUSCULAR | Status: DC | PRN

## 2015-03-26 MED ORDER — SODIUM CHLORIDE 0.9 % IJ SOLN
3.0000 mL | Freq: Two times a day (BID) | INTRAMUSCULAR | Status: DC
  Administered 2015-03-26 – 2015-03-27 (×4): 3 mL via INTRAVENOUS

## 2015-03-26 NOTE — Progress Notes (Signed)
ANTICOAGULATION CONSULT NOTE - Initial Consult  Pharmacy Consult for Heparin Indication: chest pain/ACS  Allergies  Allergen Reactions  . Cod [Fish Allergy] Nausea And Vomiting  . Codeine Nausea And Vomiting and Rash  . Morphine And Related Nausea And Vomiting, Rash and Other (See Comments)    Reaction:  Hallucinations     Patient Measurements: Height: 5\' 7"  (170.2 cm) (stated) Weight: 129 lb 8 oz (58.741 kg) (admission) IBW/kg (Calculated) : 61.6 Heparin Dosing Weight: 58.7kg  Vital Signs: Temp: 98 F (36.7 C) (11/17 1213) Temp Source: Oral (11/17 1213) BP: 162/64 mmHg (11/17 1214) Pulse Rate: 63 (11/17 1214)  Labs:  Recent Labs  03/24/15 1744  03/25/15 0408  03/25/15 1702 03/26/15 0043 03/26/15 0502 03/26/15 0857  HGB 14.2  --   --   --   --   --   --  13.6  HCT 44.3  --   --   --   --   --   --  42.3  PLT 212  --   --   --   --   --   --  200  HEPARINUNFRC  --   --   --   --   --  0.36  --  0.42  CREATININE 1.15*  --  1.22*  --   --   --  1.03*  --   TROPONINI 0.07*  < > 0.06*  < > 0.08* 0.07* 0.07*  --   < > = values in this interval not displayed.  Estimated Creatinine Clearance: 38.3 mL/min (by C-G formula based on Cr of 1.03).   Medical History: Past Medical History  Diagnosis Date  . Hypothyroidism   . Cancer ([place])   . GERD (gastroesophageal reflux disease)   . Hypertension   . Stenosis of carotid artery     a. s/p bilateral carotid enarterectomy  . Alzheimer's dementia   . Colostomy in place [facility])     a. since age 70  . HLD (hyperlipidemia)     Medications:  Scheduled:  . amLODipine  10 mg Oral BID  . aspirin EC  81 mg Oral Daily  . calcium-vitamin D  1 tablet Oral BID  . donepezil  5 mg Oral Daily  . ibuprofen  400 mg Oral QPM  . levothyroxine  100 mcg Oral QAC breakfast  . lisinopril  10 mg Oral Daily  . maprotiline  75 mg Oral Daily  . metoprolol tartrate  25 mg Oral BID  . multivitamin with minerals  1 tablet Oral QPM  . omega-3  acid ethyl esters  1 g Oral Daily  . pantoprazole  40 mg Oral Daily  . sodium chloride  3 mL Intravenous Q12H  . vitamin B-12  1,000 mcg Oral Daily  . vitamin E  400 Units Oral Daily    Assessment: DS is an 81 yo female admitted for chest pain found to have an NSTEMI. Pharmacy consulted to dose heparin. Currently ordered Heparin 700 units/hr. Per cardiology note to be continued for total of 48hr ending in the evening of 11/18.  Goal of Therapy:  Heparin level 0.3-0.7 units/ml Monitor platelets by anticoagulation protocol: Yes   Plan:  Give 3500 units bolus x 1 Start heparin infusion at 700 units/hr Check anti-Xa level in 8 hours and daily while on heparin Continue to monitor H&H and platelets   Check heparin level at 2330, assuming 1530 start time.  11/17 00:43 heparin level 0.36. Recheck in 8 hours to confirm.  11/17 09:00 heparin level of 0.42. Will check next level with am labs on 11/18 in addition to CBC.  Pharmacy will continue to follow.  Paulina Fusi, PharmD, BCPS 03/26/2015 2:21 PM

## 2015-03-26 NOTE — Evaluation (Signed)
Physical Therapy Evaluation Patient Details Name: Rachel Rush MRN: [id number] DOB: 1930/03/23 Today's Date: 03/26/2015   History of Present Illness  81 yo female with onset of chest pain and dementia, with elevated troponin and HTN, not identified as NSTEMI.    Clinical Impression  Pt was able to work with PT but is quite confused and did not answer any questions regarding home.  Very fixated on staff members, stating they were people she knew and all married to one another.  Pt is unsafe for any mobility alone, and so will plan for follow up care inpt until safer to go home.    Follow Up Recommendations SNF    Equipment Recommendations  None recommended by PT (await disposition)    Recommendations for Other Services Rehab consult     Precautions / Restrictions Precautions Precautions: Fall;Other (comment) (telemetry) Restrictions Weight Bearing Restrictions: No      Mobility  Bed Mobility Overal bed mobility: Needs Assistance Bed Mobility: Supine to Sit     Supine to sit: Mod assist        Transfers Overall transfer level: Needs assistance Equipment used: Rolling walker (2 wheeled);1 person hand held assist Transfers: Sit to/from [facility] Sit to Stand: Min assist Stand pivot transfers: Min assist       General transfer comment: reminders fro hand placement  Ambulation/Gait Ambulation/Gait assistance: Min assist Ambulation Distance (Feet): 100 Feet Assistive device: Rolling walker (2 wheeled);1 person hand held assist Gait Pattern/deviations: Step-through pattern;Wide base of support;Shuffle;Decreased stride length Gait velocity: reduced slow turns Gait velocity interpretation: Below normal speed for age/gender    Stairs            Wheelchair Mobility    Modified Rankin (Stroke Patients Only)       Balance Overall balance assessment: Needs assistance Sitting-balance support: Feet supported Sitting balance-Leahy  Scale: Fair     Standing balance support: Bilateral upper extremity supported Standing balance-Leahy Scale: Poor                               Pertinent Vitals/Pain Pain Assessment: No/denies pain    Home Living Family/patient expects to be discharged to:: Private residence Living Arrangements: Alone Available Help at Discharge: Family;Available PRN/intermittently Type of Home: House Home Access:  (pt cannot accurately answer questions)       Home Equipment: Walker - 2 wheels (per pt but not a reliable historian)      Prior Function Level of Independence: Independent with assistive device(s)               Hand Dominance        Extremity/Trunk Assessment   Upper Extremity Assessment: Overall WFL for tasks assessed           Lower Extremity Assessment: Generalized weakness      Cervical / Trunk Assessment: Kyphotic  Communication   Communication: No difficulties (confused)  Cognition Arousal/Alertness: Awake/alert Behavior During Therapy: Anxious (confusion) Overall Cognitive Status: History of cognitive impairments - at baseline       Memory: Decreased recall of precautions;Decreased short-term memory              General Comments General comments (skin integrity, edema, etc.): Pt is having some low HR per nsg immediately after sitting up in chair from walk per telemetry staff, but had normal HR of 66 just prior to her walk.  Rates in 40's right afterward.    Exercises  Assessment/Plan    PT Assessment Patient needs continued PT services  PT Diagnosis Abnormality of gait   PT Problem List Decreased strength;Decreased range of motion;Decreased activity tolerance;Decreased balance;Decreased coordination;Decreased mobility;Decreased cognition;Decreased knowledge of use of DME;Decreased safety awareness;Decreased knowledge of precautions;Cardiopulmonary status limiting activity  PT Treatment Interventions DME instruction;Gait  training;Functional mobility training;Therapeutic activities;Therapeutic exercise;Balance training;Neuromuscular re-education;Cognitive remediation;Patient/family education   PT Goals (Current goals can be found in the Care Plan section) Acute Rehab PT Goals Patient Stated Goal: none stated PT Goal Formulation: With patient Time For Goal Achievement: 04/09/15 Potential to Achieve Goals: Fair    Frequency Min 2X/week   Barriers to discharge Inaccessible home environment;Decreased caregiver support son lives 20 miles away    Co-evaluation               End of Session Equipment Utilized During Treatment: Gait belt Activity Tolerance: Patient tolerated treatment well Patient left: in chair;with call bell/phone within reach;with chair alarm set;with nursing/sitter in room Nurse Communication: Mobility status         Time: [id number] PT Time Calculation (min) (ACUTE ONLY): 29 min   Charges:   PT Evaluation $Initial PT Evaluation Tier I: 1 Procedure PT Treatments $Gait Training: 8-22 mins   PT G CodesRamond Rush Apr 11, 2015, 1:04 PM   Rachel Rush, PT MS Acute Rehab Dept. Number: [facility] [id number] and [place] [phone]

## 2015-03-26 NOTE — Progress Notes (Addendum)
Patient: Rachel Rush / Admit Date: 03/24/2015 / Date of Encounter: 03/26/2015, 10:17 AM   Subjective: No chest pain. Echo is pending. Troponin peaked at 0.11 and is down trending. On heparin gtt x 48 hours for medical treatment of elevated troponin (will be completed evening of 11/18).   Review of Systems: Review of Systems  Unable to perform ROS: dementia     Objective: Telemetry: NSR 80's Physical Exam: Blood pressure 158/81, pulse 64, temperature 98.2 F (36.8 C), temperature source Oral, resp. rate 18, height 5\' 7"  (1.702 m), weight 129 lb 8 oz (58.741 kg), SpO2 93 %. Body mass index is 20.28 kg/(m^2). General: Well developed, well nourished, in no acute distress. Head: Normocephalic, atraumatic, sclera non-icteric, no xanthomas, nares are without discharge. Neck: Negative for carotid bruits. JVP not elevated. Lungs: Clear bilaterally to auscultation without wheezes, rales, or rhonchi. Breathing is unlabored. Heart: RRR S1 S2 without murmurs, rubs, or gallops.  Abdomen: Soft, non-tender, non-distended with normoactive bowel sounds. No rebound/guarding. Extremities: No clubbing or cyanosis. No edema. Distal pedal pulses are 2+ and equal bilaterally. Neuro: Alert. Moves all extremities spontaneously. Psych:  Pleasantly demented.   Intake/Output Summary (Last 24 hours) at 03/26/15 1017 Last data filed at 03/26/15 0900  Gross per 24 hour  Intake 585.82 ml  Output   1000 ml  Net -414.18 ml    Inpatient Medications:  . amLODipine  10 mg Oral BID  . aspirin EC  81 mg Oral Daily  . calcium-vitamin D  1 tablet Oral BID  . donepezil  5 mg Oral Daily  . ibuprofen  400 mg Oral QPM  . levothyroxine  100 mcg Oral QAC breakfast  . lisinopril  10 mg Oral Daily  . maprotiline  75 mg Oral Daily  . metoprolol tartrate  25 mg Oral BID  . multivitamin with minerals  1 tablet Oral QPM  . omega-3 acid ethyl esters  1 g Oral Daily  . pantoprazole  40 mg Oral Daily  . sodium  chloride  3 mL Intravenous Q12H  . vitamin B-12  1,000 mcg Oral Daily  . vitamin E  400 Units Oral Daily   Infusions:  . heparin 700 Units/hr (03/25/15 1553)    Labs:  Recent Labs  03/25/15 0408 03/26/15 0502  NA 142 141  K 4.0 4.0  CL 108 110  CO2 27 25  GLUCOSE 111* 127*  BUN 24* 30*  CREATININE 1.22* 1.03*  CALCIUM 9.3 9.4    Recent Labs  03/24/15 1744  AST 29  ALT 27  ALKPHOS 86  BILITOT 1.8*  PROT 7.5  ALBUMIN 4.6    Recent Labs  03/24/15 1744 03/26/15 0857  WBC 14.4* 11.3*  HGB 14.2 13.6  HCT 44.3 42.3  MCV 95.4 95.2  PLT 212 200    Recent Labs  03/25/15 0945 03/25/15 1702 03/26/15 0043 03/26/15 0502  TROPONINI 0.11* 0.08* 0.07* 0.07*   Invalid input(s): POCBNP  Recent Labs  03/25/15 1702  HGBA1C 5.0     Weights: Filed Weights   03/24/15 1306 03/24/15 2201  Weight: 120 lb (54.432 kg) 129 lb 8 oz (58.741 kg)     Radiology/Studies:  Dg Chest 2 View  03/24/2015  CLINICAL DATA:  Patient was brought to ED because her son found her this morning sitting in her floor at home saying that her chest hurt. Patient has a HX of cancer, HTN, and thyroid disease. EXAM: CHEST - 2 VIEW COMPARISON:  11/22/2013 FINDINGS: Coarse  interstitial markings throughout both lungs. No confluent airspace infiltrate or overt edema. Heart size normal. Mildly tortuous atheromatous aorta. No effusion.  No pneumothorax. Severe T7 thoracic compression fracture deformity, age indeterminate. Progressive compression fracture deformity of T12 compared to the prior lumbar films of 05/02/2010. IMPRESSION: 1. Coarse interstitial markings without acute or superimposed abnormality. 2. Severe T7 and progressive T12 compression fracture deformities Electronically Signed   By: Lucrezia Europe M.D.   On: 03/24/2015 17:31   Ct Head Wo Contrast  03/24/2015  ADDENDUM REPORT: 03/24/2015 17:00 ADDENDUM: Study discussed by telephone with Dr. Conni Slipper on 03/24/2015 at 1653 hours.  Electronically Signed   By: Genevie Ann M.D.   On: 03/24/2015 17:00  03/24/2015  CLINICAL DATA:  81 year old female found sitting on floor with altered mental status today. Initial encounter. EXAM: CT HEAD WITHOUT CONTRAST TECHNIQUE: Contiguous axial images were obtained from the base of the skull through the vertex without intravenous contrast. COMPARISON:  CTA neck 07/19/2011. FINDINGS: Visualized paranasal sinuses and mastoids are clear. Osteopenia. No acute osseous abnormality identified. Postoperative changes to the right globe. No acute orbit or scalp soft tissue changes. Calcified atherosclerosis at the skull base. Cerebral volume is within normal limits for age. Mild dural calcifications. No midline shift, mass effect, or evidence of intracranial mass lesion. No ventriculomegaly. Normal for age gray-white matter differentiation. No cortically based acute infarct identified. No acute intracranial hemorrhage identified. Mild asymmetric hyperdensity of the left ICA terminus and M1 segment. IMPRESSION: Largely unremarkable for age noncontrast CT appearance of the head. Mild asymmetric density at the left ICA terminus and M1, likely due to atherosclerosis in the absence of right side weakness. Electronically Signed: By: Genevie Ann M.D. On: 03/24/2015 16:50     Assessment and Plan   1. NSTEMI/vs elevated troponin of uncertain significance: -Continue heparin gtt for total of 48 hours (will be completed at the evening of 11/18)  -Check echo to evaluate LV function, wall motion, and right-sided pressure, currently pending -Unless there is a large anterior wall MI, would likely treat conservatively via medical route given patient's underlying severe Alzheimer's dementia and lack of CHF symptoms. Patient's son is considering options at this time and wants to see echo before making a final decision  -Troponin peaked at 0.11 and has down trended to 0.07 -Continue Lopressor 25 mg bid -LDL 115 and A1C 5.0% -Would  avoid both ibuprofen and aspirin usage   2. Accelerated HTN: -Improving -Increase Norvasc to 10 mg daily -Add lisinopril 5 mg daily -Lopressor as above -Per IM  3. Alzheimer's dementia: -Patient questions about multiple family members that have passed many years prior  -Per IM  4. Renal insufficiency: -Improved -Per IM  5. Dispo: -Patient would likely have a more peaceful out come in an assisted living facility equipped to manage her above dementia    Signed, Christell Faith, PA-C Pager: [phone] 03/26/2015, 10:17 AM    Attending Note Patient seen and examined, agree with detailed note above,  Patient presentation and plan discussed on rounds.   No chest pain Echo with normal EF, no WMA. BP improving, She is tired but otherwise feels well.  No further cardiac workup needed --Will d/c heparin infusion. Dispo per medicine service  Signed: Esmond Plants  M.D., Ph.D. [facility]

## 2015-03-26 NOTE — Progress Notes (Signed)
[place] [facility] [place] NAME: Rachel Rush    MR#:  [id number]  DATE OF BIRTH:  10-07-1929  SUBJECTIVE:  CHIEF COMPLAINT:   Chief Complaint  Patient presents with  . Chest Pain   Patient is 81 year old Caucasian female with past medical history significant for history of hypertension, Alzheimer's dementia, hypothyroidism who presents to the hospital with complaints of chest pains. On arrival to the hospital patient was noted to have elevated blood pressure as well as elevated troponin. She was seen by cardiologist who recommended heparin therapy for 48 hours for non-Q-wave MI. Patient feels satisfactory today. Denies any chest pains. Family requested placement to a rehabilitation facility where patient will be discharged tomorrow.   . Review of Systems  Unable to perform ROS: dementia   denies any pain  VITAL SIGNS: Blood pressure 162/64, pulse 63, temperature 98 F (36.7 C), temperature source Oral, resp. rate 18, height 5\' 7"  (1.702 m), weight 58.741 kg (129 lb 8 oz), SpO2 96 %.  PHYSICAL EXAMINATION:   GENERAL:  81 y.o.-year-old patient lying in the bed with no acute distress.  EYES: Pupils equal, round, reactive to light and accommodation. No scleral icterus. Extraocular muscles intact.  HEENT: Head atraumatic, normocephalic. Oropharynx and nasopharynx clear.  NECK:  Supple, no jugular venous distention. No thyroid enlargement, no tenderness.  LUNGS: Normal breath sounds bilaterally, no wheezing, rales,rhonchi or crepitation. No use of accessory muscles of respiration.  CARDIOVASCULAR: S1, S2 normal. No murmurs, rubs, or gallops.  ABDOMEN: Soft, nontender, nondistended. Bowel sounds present. No organomegaly or mass.  EXTREMITIES: No pedal edema, cyanosis, or clubbing.  NEUROLOGIC: Cranial nerves II through XII are intact. Muscle strength 5/5 in all extremities. Sensation intact. Gait not checked.  PSYCHIATRIC: The patient is  alert and oriented x 3.  SKIN: No obvious rash, lesion, or ulcer.   ORDERS/RESULTS REVIEWED:   CBC  Recent Labs Lab 03/24/15 1744 03/26/15 0857  WBC 14.4* 11.3*  HGB 14.2 13.6  HCT 44.3 42.3  PLT 212 200  MCV 95.4 95.2  MCH 30.5 30.7  MCHC 32.0 32.2  RDW 14.8* 14.6*   ------------------------------------------------------------------------------------------------------------------  Chemistries   Recent Labs Lab 03/24/15 1744 03/25/15 0408 03/26/15 0502  NA 139 142 141  K 4.3 4.0 4.0  CL 105 108 110  CO2 27 27 25   GLUCOSE 121* 111* 127*  BUN 26* 24* 30*  CREATININE 1.15* 1.22* 1.03*  CALCIUM 9.5 9.3 9.4  AST 29  --   --   ALT 27  --   --   ALKPHOS 86  --   --   BILITOT 1.8*  --   --    ------------------------------------------------------------------------------------------------------------------ estimated creatinine clearance is 38.3 mL/min (by C-G formula based on Cr of 1.03). ------------------------------------------------------------------------------------------------------------------ No results for input(s): TSH, T4TOTAL, T3FREE, THYROIDAB in the last 72 hours.  Invalid input(s): FREET3  Cardiac Enzymes  Recent Labs Lab 03/25/15 1702 03/26/15 0043 03/26/15 0502  TROPONINI 0.08* 0.07* 0.07*   ------------------------------------------------------------------------------------------------------------------ Invalid input(s): POCBNP ---------------------------------------------------------------------------------------------------------------  RADIOLOGY: Dg Chest 2 View  03/24/2015  CLINICAL DATA:  Patient was brought to ED because her son found her this morning sitting in her floor at home saying that her chest hurt. Patient has a HX of cancer, HTN, and thyroid disease. EXAM: CHEST - 2 VIEW COMPARISON:  11/22/2013 FINDINGS: Coarse interstitial markings throughout both lungs. No confluent airspace infiltrate or overt edema. Heart size normal.  Mildly tortuous atheromatous aorta. No effusion.  No pneumothorax.  Severe T7 thoracic compression fracture deformity, age indeterminate. Progressive compression fracture deformity of T12 compared to the prior lumbar films of 05/02/2010. IMPRESSION: 1. Coarse interstitial markings without acute or superimposed abnormality. 2. Severe T7 and progressive T12 compression fracture deformities Electronically Signed   By: Lucrezia Europe M.D.   On: 03/24/2015 17:31    EKG:  Orders placed or performed during the hospital encounter of 03/24/15  . EKG 12-Lead  . EKG 12-Lead  . ED EKG within 10 minutes  . ED EKG within 10 minutes    ASSESSMENT AND PLAN:  Active Problems:   Chest pain 1. Non-Q-wave MI,  possibly related to hypertension, however, cannot rule out underlying coronary artery disease, continue aspirin, metoprolol, nitroglycerin ,heparin intravenously for 48 hours per cardiologist's recommendations, echocardiogram , real diastolic dysfunction, normal ejection fraction and elevated pulmonary arterial pressure , appreciate cardiology input 2. Malignant essential hypertension, continue metoprolol as well as nitroglycerin topically, Norvasc and advance lisinopril per cardiologist, following blood pressure readings and make decisions about advancement medications if needed 3. Renal insufficiency. Better stable with therapy 4. Leukocytosis, etiology could be stress, off antibiotics and improved white blood cell count 5. Abnormal urinalysis with no pyuria initially thought to be urinary tract infection, urine cultures are taken, negative.   Management plans discussed with the patient, family and they are in agreement.   DRUG ALLERGIES:  Allergies  Allergen Reactions  . Cod [Fish Allergy] Nausea And Vomiting  . Codeine Nausea And Vomiting and Rash  . Morphine And Related Nausea And Vomiting, Rash and Other (See Comments)    Reaction:  Hallucinations     CODE STATUS:     Code Status Orders         Start     Ordered   03/24/15 2201  Do not attempt resuscitation (DNR)   Continuous    Question Answer Comment  In the event of cardiac or respiratory ARREST Do not call a "code blue"   In the event of cardiac or respiratory ARREST Do not perform Intubation, CPR, defibrillation or ACLS   In the event of cardiac or respiratory ARREST Use medication by any route, position, wound care, and other measures to relive pain and suffering. May use oxygen, suction and manual treatment of airway obstruction as needed for comfort.      03/24/15 2200    Advance Directive Documentation        Most Recent Value   Type of Advance Directive  Healthcare Power of Attorney   Pre-existing out of facility DNR order (yellow form or pink MOST form)     "MOST" Form in Place?        TOTAL TIME TAKING CARE OF THIS PATIENT: 35 minutes.    Theodoro Grist M.D on 03/26/2015 at 4:53 PM  Between 7am to 6pm - Pager - [phone]  After 6pm go to [email or web address] - password [email or web address] [facility]  [facility]  Office  [phone]  CC: Primary care physician; No primary care provider on file.

## 2015-03-26 NOTE — Clinical Social Work Placement (Signed)
   CLINICAL SOCIAL WORK PLACEMENT  NOTE  Date:  03/26/2015  Patient Details  Name: Rachel Rush MRN: [id number] Date of Birth: [birth year]  Clinical Social Work is seeking post-discharge placement for this patient at the [place] level of care (*CSW will initial, date and re-position this form in  chart as items are completed):  Yes   Patient/family provided with [place] Work Department's list of facilities offering this level of care within the geographic area requested by the patient (or if unable, by the patient's family).  Yes   Patient/family informed of their freedom to choose among providers that offer the needed level of care, that participate in Medicare, Medicaid or managed care program needed by the patient, have an available bed and are willing to accept the patient.  Yes   Patient/family informed of [facility]'s ownership interest in [facility] and [facility], as well as of the fact that they are under no obligation to receive care at these facilities.  PASRR submitted to EDS on       PASRR number received on       Existing PASRR number confirmed on 03/26/15     FL2 transmitted to all facilities in geographic area requested by pt/family on 03/26/15     FL2 transmitted to all facilities within larger geographic area on       Patient informed that his/her managed care company has contracts with or will negotiate with certain facilities, including the following:        Yes   Patient/family informed of bed offers received.  Patient chooses bed at [facility]     Physician recommends and patient chooses bed at      Patient to be transferred to [facility] on  .  Patient to be transferred to facility by       Patient family notified on   of transfer.  Name of family member notified:        PHYSICIAN Please sign DNR, Please sign FL2     Additional Comment:     _______________________________________________ Alonna Buckler, LCSW 03/26/2015, 3:29 PM

## 2015-03-26 NOTE — Progress Notes (Signed)
CSW faxed clinical documents to [facility] for preauth for SNF.   Toma Copier, [place]

## 2015-03-26 NOTE — Progress Notes (Signed)
ANTICOAGULATION CONSULT NOTE - Initial Consult  Pharmacy Consult for Heparin Indication: chest pain/ACS  Allergies  Allergen Reactions  . Cod [Fish Allergy] Nausea And Vomiting  . Codeine Nausea And Vomiting and Rash  . Morphine And Related Nausea And Vomiting, Rash and Other (See Comments)    Reaction:  Hallucinations     Patient Measurements: Height: 5\' 7"  (170.2 cm) (stated) Weight: 129 lb 8 oz (58.741 kg) (admission) IBW/kg (Calculated) : 61.6 Heparin Dosing Weight: 58.7kg  Vital Signs: Temp: 98.7 F (37.1 C) (11/16 2021) Temp Source: Oral (11/16 2021) BP: 175/64 mmHg (11/16 2021) Pulse Rate: 83 (11/16 2021)  Labs:  Recent Labs  03/24/15 1744  03/25/15 0408 03/25/15 0945 03/25/15 1702 03/26/15 0043  HGB 14.2  --   --   --   --   --   HCT 44.3  --   --   --   --   --   PLT 212  --   --   --   --   --   HEPARINUNFRC  --   --   --   --   --  0.36  CREATININE 1.15*  --  1.22*  --   --   --   TROPONINI 0.07*  < > 0.06* 0.11* 0.08* 0.07*  < > = values in this interval not displayed.  Estimated Creatinine Clearance: 32.4 mL/min (by C-G formula based on Cr of 1.22).   Medical History: Past Medical History  Diagnosis Date  . Hypothyroidism   . Cancer ([place])   . GERD (gastroesophageal reflux disease)   . Hypertension   . Stenosis of carotid artery     a. s/p bilateral carotid enarterectomy  . Alzheimer's dementia   . Colostomy in place [facility])     a. since age 81  . HLD (hyperlipidemia)     Medications:  Scheduled:  . amLODipine  10 mg Oral BID  . aspirin EC  81 mg Oral Daily  . calcium-vitamin D  1 tablet Oral BID  . donepezil  5 mg Oral Daily  . ibuprofen  400 mg Oral QPM  . levothyroxine  100 mcg Oral QAC breakfast  . lisinopril  5 mg Oral Daily  . maprotiline  75 mg Oral Daily  . metoprolol tartrate  25 mg Oral BID  . multivitamin with minerals  1 tablet Oral QPM  . omega-3 acid ethyl esters  1 g Oral Daily  . pantoprazole  40 mg Oral Daily  .  sodium chloride  3 mL Intravenous Q12H  . vitamin B-12  1,000 mcg Oral Daily  . vitamin E  400 Units Oral Daily    Assessment: DS is an 81 yo female admitted for chest pain found to have an NSTEMI. Pharmacy consulted to dose heparin.  Goal of Therapy:  Heparin level 0.3-0.7 units/ml Monitor platelets by anticoagulation protocol: Yes   Plan:  Give 3500 units bolus x 1 Start heparin infusion at 700 units/hr Check anti-Xa level in 8 hours and daily while on heparin Continue to monitor H&H and platelets   Check heparin level at 2330, assuming 1530 start time.  11/17 00:43 heparin level 0.36. Recheck in 8 hours to confirm.  Pharmacy will continue to follow.  Taray Normoyle S 03/26/2015,2:56 AM

## 2015-03-26 NOTE — Care Management (Signed)
Spoke with patient's son Annie Main.  He is very firm and emotional  in his statement that he can not take his mother back home regardless if a facility is not going to be covered by insurance.  Discussed that physical therapy will be assessing patient and making recommendations on level of placement.  Discussed that if physical therapy recommended skilled nursing, then insurance would have to approve.  Also discussed that if approved for skilled nursing, patient could transition to a memory care setting.  Assured Mr Perk that CM and CSW will do everything possible to provide assist with this very difficult decision.  Updated clinical social work

## 2015-03-26 NOTE — Progress Notes (Signed)
CSW received authorization from insurance for STR at [facility] starting on 03/27/15.  CSW updated Pt's son. He has appt at [facility] office on Monday to start LTC medicaid process.  Pt's son will be able to provide transportation to SNF at dc.    Toma Copier, [place]

## 2015-03-26 NOTE — Clinical Social Work Note (Signed)
Clinical Social Work Assessment  Patient Details  Name: Rachel Rush MRN: [id number] Date of Birth: Dec 07, 1929  Date of referral:  03/26/15               Reason for consult:  Facility Placement                Permission sought to share information with:  Family Supports, Customer service manager, Case Optician, dispensing granted to share information::  Yes, Verbal Permission Granted  Name::     son Akera Bena  Agency::  SNFs  Relationship::  Son & daughter in Financial trader Information:     Housing/Transportation Living arrangements for the past 2 months:  [place] of Information:  Patient, Adult Children, Power of Forensic psychologist, Scientist, water quality, Other (Comment Required) (caregiver Surveyor, minerals) Patient Interpreter Needed:  None Criminal Activity/Legal Involvement Pertinent to Current Situation/Hospitalization:  No - Comment as needed Significant Relationships:  Adult Children Lives with:  Self Do you feel safe going back to the place where you live?  Yes Need for family participation in patient care:  Yes (Comment)  Care giving concerns:  Pt lives alone. Has a caregiver during the day and family helps in the evening until Pt is in bed.   Social Worker assessment / plan:  CSW was referred to Pt to assist with dc planning. Pt is widowed, has one son, is retired from years of working at [employer] and [employer]. CSW spoke to Pt's son on the phone regarding transitioning Pt to a SNF at dc. Pt wants to go home but has been to rehab before and is okay with rehab. Pt will likely need LTC unless she progresses to intermittent supervision and return home.   Employment status:  Retired Nurse, adult PT Recommendations:  [place] / Referral to community resources:  [place]  Patient/Family's Response to care:  Pt's family concerned with Pt's decline in functionality and independence. Pt's son wants Pt to  "get the care she deserves and we cannot do that."   Patient/Family's Understanding of and Emotional Response to Diagnosis, Current Treatment, and Prognosis:  Pt states that she wants to go home, when asked where that is she states "anywhere that will take me.   Emotional Assessment Appearance:  Appears stated age Attitude/Demeanor/Rapport:   (engaged) Affect (typically observed):  Accepting, Calm Orientation:  Oriented to Self, Oriented to Place, Oriented to Situation Alcohol / Substance use:  Never Used Psych involvement (Current and /or in the community):  No (Comment)  Discharge Needs  Concerns to be addressed:  Discharge Planning Concerns, Adjustment to Illness Readmission within the last 30 days:  No Current discharge risk:  Cognitively Impaired, Dependent with Mobility Barriers to Discharge:  Insurance Authorization, Barriers Resolved   Alonna Buckler, LCSW 03/26/2015, 2:58 PM

## 2015-03-26 NOTE — NC FL2 (Signed)
[place] LEVEL OF CARE SCREENING TOOL     IDENTIFICATION  Patient Name: Rachel Rush Birthdate: 1930-01-20 Sex: female Admission Date (Current Location): 03/24/2015  [place] and [place] Number: [facility] and Address:  [facility], [address] [place], [place] [address]      Provider Number: [id number]  Attending Physician Name and Address:  Theodoro Grist, MD  Relative Name and Phone Number:       Current Level of Care: Hospital Recommended Level of Care: [place] Prior Approval Number:    Date Approved/Denied:   PASRR Number:    Discharge Plan: SNF    Current Diagnoses: Patient Active Problem List   Diagnosis Date Noted  . Chest pain 03/24/2015      Hypothyroidism    . Cancer ([place])   . GERD (gastroesophageal reflux disease)   . Hypertension   . Stenosis of carotid artery     a. s/p bilateral carotid enarterectomy  . Alzheimer's dementia   . Colostomy in place [facility])     a. since age 24  . HLD (hyperlipidemia)           Orientation ACTIVITIES/SOCIAL BLADDER RESPIRATION    Self, Place, Situation  Family supportive Incontinent Normal  BEHAVIORAL SYMPTOMS/MOOD NEUROLOGICAL BOWEL NUTRITION STATUS      Colostomy    PHYSICIAN VISITS COMMUNICATION OF NEEDS Height & Weight Skin  30 days Verbally   129 lbs.            AMBULATORY STATUS RESPIRATION    Assist extensive Normal      Personal Care Assistance Level of Assistance  Bathing, Feeding, Dressing Bathing Assistance: Limited assistance Feeding assistance: Limited assistance Dressing Assistance: Limited assistance      Functional Limitations Info                SPECIAL CARE FACTORS FREQUENCY  PT (By licensed PT), OT (By licensed OT)     PT Frequency: 5 x week OT Frequency: 3 x week           Additional Factors Info  Code Status, Allergies Code Status Info:  DNR Allergies Info: Codeine, morphine related, Cod (fish)           Current Medications (03/26/2015): Current Facility-Administered Medications  Medication Dose Route Frequency Provider Last Rate Last Dose  . acetaminophen (TYLENOL) tablet 650 mg  650 mg Oral Q6H PRN Fritzi Mandes, MD       Or  . acetaminophen (TYLENOL) suppository 650 mg  650 mg Rectal Q6H PRN Fritzi Mandes, MD      . amLODipine (NORVASC) tablet 10 mg  10 mg Oral BID Areta Haber Dunn, PA-C   10 mg at 03/26/15 0940  . aspirin EC tablet 81 mg  81 mg Oral Daily Fritzi Mandes, MD   81 mg at 03/26/15 0939  . calcium-vitamin D (OSCAL WITH D) 500-200 MG-UNIT per tablet 1 tablet  1 tablet Oral BID Fritzi Mandes, MD   1 tablet at 03/26/15 0939  . donepezil (ARICEPT) tablet 5 mg  5 mg Oral Daily Fritzi Mandes, MD   5 mg at 03/26/15 0939  . heparin ADULT infusion 100 units/mL (25000 units/250 mL)  700 Units/hr Intravenous Continuous Vena Rua, RPH 7 mL/hr at 03/25/15 1553 700 Units/hr at 03/25/15 1553  . hydrALAZINE (APRESOLINE) injection 10 mg  10 mg Intravenous Q6H PRN Fritzi Mandes, MD   10 mg at 03/25/15 [id number]  . ibuprofen (ADVIL,MOTRIN) tablet  400 mg  400 mg Oral QPM Fritzi Mandes, MD   400 mg at 03/25/15 1836  . levothyroxine (SYNTHROID, LEVOTHROID) tablet 100 mcg  100 mcg Oral QAC breakfast Fritzi Mandes, MD   100 mcg at 03/26/15 0830  . lisinopril (PRINIVIL,ZESTRIL) tablet 10 mg  10 mg Oral Daily Theodoro Grist, MD   10 mg at 03/26/15 0940  . maprotiline (LUDIOMIL) tablet 75 mg  75 mg Oral Daily Fritzi Mandes, MD   75 mg at 03/26/15 0944  . metoprolol tartrate (LOPRESSOR) tablet 25 mg  25 mg Oral BID Fritzi Mandes, MD   25 mg at 03/26/15 0942  . multivitamin with minerals tablet 1 tablet  1 tablet Oral QPM Fritzi Mandes, MD   1 tablet at 03/25/15 1836  . omega-3 acid ethyl esters (LOVAZA) capsule 1 g  1 g Oral Daily Fritzi Mandes, MD   1 g at 03/26/15 0800  . ondansetron (ZOFRAN) tablet 4 mg  4 mg Oral Q6H PRN Fritzi Mandes, MD       Or  . ondansetron (ZOFRAN)  injection 4 mg  4 mg Intravenous Q6H PRN Fritzi Mandes, MD      . pantoprazole (PROTONIX) EC tablet 40 mg  40 mg Oral Daily Fritzi Mandes, MD   40 mg at 03/26/15 [id number]  . sodium chloride 0.9 % injection 3 mL  3 mL Intravenous PRN Theodoro Grist, MD      . sodium chloride 0.9 % injection 3 mL  3 mL Intravenous Q12H Theodoro Grist, MD   3 mL at 03/26/15 0941  . vitamin B-12 (CYANOCOBALAMIN) tablet 1,000 mcg  1,000 mcg Oral Daily Fritzi Mandes, MD   1,000 mcg at 03/26/15 [phone]  . vitamin E capsule 400 Units  400 Units Oral Daily Fritzi Mandes, MD   400 Units at 03/26/15 [id number]   Do not use this list as official medication orders. Please verify with discharge summary.  Discharge Medications:   Medication List    ASK your doctor about these medications        aspirin EC 81 MG tablet  Take 81 mg by mouth daily.     CALCIUM 600+D 600-800 MG-UNIT Tabs  Generic drug:  Calcium Carb-Cholecalciferol  Take 1 tablet by mouth 2 (two) times daily.     donepezil 5 MG tablet  Commonly known as:  ARICEPT  Take 5 mg by mouth daily.     Fish Oil 1000 MG Caps  Take 1,000 mg by mouth daily.     ibuprofen 200 MG tablet  Commonly known as:  ADVIL,MOTRIN  Take 400 mg by mouth every evening.     lansoprazole 30 MG capsule  Commonly known as:  PREVACID  Take 30 mg by mouth daily.     levothyroxine 100 MCG tablet  Commonly known as:  SYNTHROID, LEVOTHROID  Take 100 mcg by mouth daily before breakfast.     maprotiline 25 MG tablet  Commonly known as:  LUDIOMIL  Take 75 mg by mouth daily.     multivitamin with minerals Tabs tablet  Take 1 tablet by mouth every evening.     telmisartan 80 MG tablet  Commonly known as:  MICARDIS  Take 80 mg by mouth daily.     vitamin B-12 1000 MCG tablet  Commonly known as:  CYANOCOBALAMIN  Take 1,000 mcg by mouth daily.     vitamin E 400 UNIT capsule  Generic drug:  vitamin E  Take 400 Units by mouth daily.  Relevant Imaging Results:  Relevant Lab  Results:  Recent Labs    Additional [place], LCSW

## 2015-03-27 ENCOUNTER — Telehealth: Payer: Self-pay

## 2015-03-27 DIAGNOSIS — I10 Essential (primary) hypertension: Secondary | ICD-10-CM

## 2015-03-27 DIAGNOSIS — R531 Weakness: Secondary | ICD-10-CM

## 2015-03-27 DIAGNOSIS — N289 Disorder of kidney and ureter, unspecified: Secondary | ICD-10-CM

## 2015-03-27 DIAGNOSIS — I517 Cardiomegaly: Secondary | ICD-10-CM

## 2015-03-27 DIAGNOSIS — I272 Pulmonary hypertension, unspecified: Secondary | ICD-10-CM

## 2015-03-27 LAB — CBC
HEMATOCRIT: 40.9 % (ref 35.0–47.0)
HEMOGLOBIN: 13.3 g/dL (ref 12.0–16.0)
MCH: 31.2 pg (ref 26.0–34.0)
MCHC: 32.6 g/dL (ref 32.0–36.0)
MCV: 95.6 fL (ref 80.0–100.0)
Platelets: 202 10*3/uL (ref 150–440)
RBC: 4.28 MIL/uL (ref 3.80–5.20)
RDW: 14.3 % (ref 11.5–14.5)
WBC: 9.6 10*3/uL (ref 3.6–11.0)

## 2015-03-27 LAB — HEPARIN LEVEL (UNFRACTIONATED): Heparin Unfractionated: <0.1 IU/mL — ABNORMAL LOW (ref 0.30–0.70)

## 2015-03-27 MED ORDER — METOPROLOL TARTRATE 25 MG PO TABS: 25.0000 mg | ORAL_TABLET | Freq: Two times a day (BID) | ORAL | Status: AC

## 2015-03-27 MED ORDER — PANTOPRAZOLE SODIUM 40 MG PO TBEC: 40.0000 mg | DELAYED_RELEASE_TABLET | Freq: Every day | ORAL | Status: AC

## 2015-03-27 MED ORDER — AMLODIPINE BESYLATE 10 MG PO TABS: 10.0000 mg | ORAL_TABLET | Freq: Two times a day (BID) | ORAL | Status: AC

## 2015-03-27 NOTE — Clinical Social Work Placement (Signed)
   CLINICAL SOCIAL WORK PLACEMENT  NOTE  Date:  03/27/2015  Patient Details  Name: Rachel Rush MRN: [id number] Date of Birth: 1930/03/24  Clinical Social Work is seeking post-discharge placement for this patient at the [place] level of care (*CSW will initial, date and re-position this form in  chart as items are completed):  Yes   Patient/family provided with [place] Work Department's list of facilities offering this level of care within the geographic area requested by the patient (or if unable, by the patient's family).  Yes   Patient/family informed of their freedom to choose among providers that offer the needed level of care, that participate in Medicare, Medicaid or managed care program needed by the patient, have an available bed and are willing to accept the patient.  Yes   Patient/family informed of [place] [facility]'s ownership interest in [facility] and [facility], as well as of the fact that they are under no obligation to receive care at these facilities.  PASRR submitted to EDS on       PASRR number received on       Existing PASRR number confirmed on 03/26/15     FL2 transmitted to all facilities in geographic area requested by pt/family on 03/26/15     FL2 transmitted to all facilities within larger geographic area on       Patient informed that his/her managed care company has contracts with or will negotiate with certain facilities, including the following:        Yes   Patient/family informed of bed offers received.  Patient chooses bed at [facility]     Physician recommends and patient chooses bed at      Patient to be transferred to [facility] on  .  Patient to be transferred to facility by Family     Patient family notified on 03/27/15 of transfer.  Name of family member notified:  Son providing transport     PHYSICIAN Please sign DNR, Please sign FL2     Additional  Comment:    _______________________________________________ Alonna Buckler, LCSW 03/27/2015, 10:44 AM

## 2015-03-27 NOTE — Progress Notes (Signed)
Patient remains alert , up to [facility] as needed throughout the night, no complaint of pain or discomfort, plan to D/C patient to SNF.

## 2015-03-27 NOTE — Telephone Encounter (Signed)
-----   Message from Blain Pais sent at 03/27/2015 12:28 PM EST ----- Regarding: tcm/ph 05/05/2015 2:30 Christell Faith, PA-C

## 2015-03-27 NOTE — Discharge Summary (Signed)
[place] [facility] [place] NAME: Rachel Rush    MR#:  [id number]  DATE OF BIRTH:  [birth year]  DATE OF ADMISSION:  03/24/2015 ADMITTING PHYSICIAN: Fritzi Mandes, MD  DATE OF DISCHARGE: No discharge date for patient encounter.  PRIMARY CARE PHYSICIAN: No primary care provider on file.     ADMISSION DIAGNOSIS:  Confusion [R41.0] Elevated white blood cell count [D72.829] Elevated troponin [R79.89] Chest pain, unspecified chest pain type [R07.9]  DISCHARGE DIAGNOSIS:  Principal Problem:   Acute non Q wave MI (myocardial infarction), initial episode of care [facility]) Active Problems:   Pain in the chest   Elevated troponin   Confusion   Elevated white blood cell count   Essential hypertension, malignant   Renal insufficiency   Mild concentric left ventricular hypertrophy (LVH)   Pulmonary hypertension (HCC)   Generalized weakness   SECONDARY DIAGNOSIS:   Past Medical History  Diagnosis Date  . Hypothyroidism   . Cancer ([place])   . GERD (gastroesophageal reflux disease)   . Hypertension   . Stenosis of carotid artery     a. s/p bilateral carotid enarterectomy  . Alzheimer's dementia   . Colostomy in place [facility])     a. since age 38  . HLD (hyperlipidemia)     .pro HOSPITAL COURSE:   Patient is 81 year old Caucasian female with past medical history significant for history of hypertension, Alzheimer's dementia, hypothyroidism who presented to the hospital with complaints of chest pains. On arrival to the hospital patient was noted to have elevated blood pressure as well as elevated troponin. She was also noted to be more confused. Head CT was performed without contrast which revealed no new changes. Chest x-ray showed coarse interstitial markings and the T7 as well as T12 compression fracture and deformities but physical exam failed to reveal any spinal pain on percussion or palpation. Patient was seen by cardiologist who felt that  patient had a non-Q-wave MI and medical therapy with heparin for 48 hours was recommended as well as aspirin and metoprolol. Urine cultures are pending.  Discussion by problem 1. Non-Q-wave MI, possibly related to hypertension, however, cannot rule out underlying coronary artery disease, continue aspirin, metoprolol,  patient received intravenous heparin for 48 hours while in the hospital per cardiologist's recommendations, echocardiogram revealed normal ejection fraction, grade 1 diastolic dysfunction, mild LVH, mild mitral regurgitation and pulmonary hypertension . Patient is to follow-up with Dr. Candis Musa as outpatient 2. Malignant essential hypertension, continue metoprolol  Norvasc and resume telmisartan , follow-up with primary care physician and cardiologist and advanced medications as needed  3. Renal insufficiency. Better with therapy, likely poorly controlled blood pressure related follow as outpatient closely  4. Leukocytosis, etiology. Likely stress, off antibiotics and improved white blood cell count 5. Abnormal urinalysis with no pyuria,  initially thought to be urinary tract infection, urine cultures are taken, pending. Not on antibiotic   DISCHARGE CONDITIONS:   Stable  CONSULTS OBTAINED:  Treatment Team:  Leonie Man, MD Minna Merritts, MD  DRUG ALLERGIES:   Allergies  Allergen Reactions  . Cod [Fish Allergy] Nausea And Vomiting  . Codeine Nausea And Vomiting and Rash  . Morphine And Related Nausea And Vomiting, Rash and Other (See Comments)    Reaction:  Hallucinations     DISCHARGE MEDICATIONS:   Current Discharge Medication List    START taking these medications   Details  amLODipine (NORVASC) 10 MG tablet Take 1 tablet (10 mg total) by  mouth 2 (two) times daily. Qty: 30 tablet, Refills: 6    metoprolol tartrate (LOPRESSOR) 25 MG tablet Take 1 tablet (25 mg total) by mouth 2 (two) times daily. Qty: 60 tablet, Refills: 6    pantoprazole (PROTONIX) 40 MG  tablet Take 1 tablet (40 mg total) by mouth daily. Qty: 30 tablet, Refills: 2      CONTINUE these medications which have NOT CHANGED   Details  aspirin EC 81 MG tablet Take 81 mg by mouth daily.    Calcium Carb-Cholecalciferol (CALCIUM 600+D) 600-800 MG-UNIT TABS Take 1 tablet by mouth 2 (two) times daily.    donepezil (ARICEPT) 5 MG tablet Take 5 mg by mouth daily.     lansoprazole (PREVACID) 30 MG capsule Take 30 mg by mouth daily.     levothyroxine (SYNTHROID, LEVOTHROID) 100 MCG tablet Take 100 mcg by mouth daily before breakfast.    maprotiline (LUDIOMIL) 25 MG tablet Take 75 mg by mouth daily.    Multiple Vitamin (MULTIVITAMIN WITH MINERALS) TABS tablet Take 1 tablet by mouth every evening.    Omega-3 Fatty Acids (FISH OIL) 1000 MG CAPS Take 1,000 mg by mouth daily.    telmisartan (MICARDIS) 80 MG tablet Take 80 mg by mouth daily.    vitamin B-12 (CYANOCOBALAMIN) 1000 MCG tablet Take 1,000 mcg by mouth daily.    vitamin E (VITAMIN E) 400 UNIT capsule Take 400 Units by mouth daily.      STOP taking these medications     ibuprofen (ADVIL,MOTRIN) 200 MG tablet          DISCHARGE INSTRUCTIONS:    Patient is to follow-up with primary care physician and cardiologist as outpatient  If you experience worsening of your admission symptoms, develop shortness of breath, life threatening emergency, suicidal or homicidal thoughts you must seek medical attention immediately by calling 911 or calling your MD immediately  if symptoms less severe.  You Must read complete instructions/literature along with all the possible adverse reactions/side effects for all the Medicines you take and that have been prescribed to you. Take any new Medicines after you have completely understood and accept all the possible adverse reactions/side effects.   Please note  You were cared for by a hospitalist during your hospital stay. If you have any questions about your discharge medications or the  care you received while you were in the hospital after you are discharged, you can call the unit and asked to speak with the hospitalist on call if the hospitalist that took care of you is not available. Once you are discharged, your primary care physician will handle any further medical issues. Please note that NO REFILLS for any discharge medications will be authorized once you are discharged, as it is imperative that you return to your primary care physician (or establish a relationship with a primary care physician if you do not have one) for your aftercare needs so that they can reassess your need for medications and monitor your lab values.    Today   CHIEF COMPLAINT:   Chief Complaint  Patient presents with  . Chest Pain    HISTORY OF PRESENT ILLNESS:  Rachel Rush  is a 81 y.o. female with a known history of hypertension, Alzheimer's dementia, hypothyroidism who presented to the hospital with complaints of chest pains. On arrival to the hospital patient was noted to have elevated blood pressure as well as elevated troponin. She was also noted to be more confused. Head CT was performed without contrast  which revealed no new changes. Chest x-ray showed coarse interstitial markings and the T7 as well as T12 compression fracture and deformities but physical exam failed to reveal any spinal pain on percussion or palpation. Patient was seen by cardiologist who felt that patient had a non-Q-wave MI and medical therapy with heparin for 48 hours was recommended as well as aspirin and metoprolol. Urine cultures are pending.  Discussion by problem 1. Non-Q-wave MI, possibly related to hypertension, however, cannot rule out underlying coronary artery disease, continue aspirin, metoprolol,  patient received intravenous heparin for 48 hours while in the hospital per cardiologist's recommendations, echocardiogram revealed normal ejection fraction, grade 1 diastolic dysfunction, mild LVH, mild mitral  regurgitation and pulmonary hypertension . Patient is to follow-up with Dr. Candis Musa as outpatient 2. Malignant essential hypertension, continue metoprolol  Norvasc and resume telmisartan , follow-up with primary care physician and cardiologist and advanced medications as needed  3. Renal insufficiency. Better with therapy, likely poorly controlled blood pressure related follow as outpatient closely  4. Leukocytosis, etiology. Likely stress, off antibiotics and improved white blood cell count 5. Abnormal urinalysis with no pyuria,  initially thought to be urinary tract infection, urine cultures are taken, pending. Not on antibiotic    VITAL SIGNS:  Blood pressure 133/55, pulse 66, temperature 97.9 F (36.6 C), temperature source Oral, resp. rate 18, height 5\' 7"  (1.702 m), weight 58.741 kg (129 lb 8 oz), SpO2 100 %.  I/O:   Intake/Output Summary (Last 24 hours) at 03/27/15 1027 Last data filed at 03/27/15 0500  Gross per 24 hour  Intake    240 ml  Output   1200 ml  Net   -960 ml    PHYSICAL EXAMINATION:  GENERAL:  81 y.o.-year-old patient lying in the bed with no acute distress.  EYES: Pupils equal, round, reactive to light and accommodation. No scleral icterus. Extraocular muscles intact.  HEENT: Head atraumatic, normocephalic. Oropharynx and nasopharynx clear.  NECK:  Supple, no jugular venous distention. No thyroid enlargement, no tenderness.  LUNGS: Normal breath sounds bilaterally, no wheezing, rales,rhonchi or crepitation. No use of accessory muscles of respiration.  CARDIOVASCULAR: S1, S2 normal. No murmurs, rubs, or gallops.  ABDOMEN: Soft, non-tender, non-distended. Bowel sounds present. No organomegaly or mass.  EXTREMITIES: No pedal edema, cyanosis, or clubbing.  NEUROLOGIC: Cranial nerves II through XII are intact. Muscle strength 5/5 in all extremities. Sensation intact. Gait not checked.  PSYCHIATRIC: The patient is alert and oriented x 3.  SKIN: No obvious rash, lesion,  or ulcer.   DATA REVIEW:   CBC  Recent Labs Lab 03/27/15 0501  WBC 9.6  HGB 13.3  HCT 40.9  PLT 202    Chemistries   Recent Labs Lab 03/24/15 1744  03/26/15 0502  NA 139  < > 141  K 4.3  < > 4.0  CL 105  < > 110  CO2 27  < > 25  GLUCOSE 121*  < > 127*  BUN 26*  < > 30*  CREATININE 1.15*  < > 1.03*  CALCIUM 9.5  < > 9.4  AST 29  --   --   ALT 27  --   --   ALKPHOS 86  --   --   BILITOT 1.8*  --   --   < > = values in this interval not displayed.  Cardiac Enzymes  Recent Labs Lab 03/26/15 0502  TROPONINI 0.07*    Microbiology Results  Results for orders placed or performed during the  hospital encounter of 03/24/15  Urine culture     Status: None (Preliminary result)   Collection Time: 03/24/15  6:30 PM  Result Value Ref Range Status   Specimen Description URINE, RANDOM  Final   Special Requests NONE  Final   Culture HOLDING FOR POSSIBLE PATHOGEN  Final   Report Status PENDING  Incomplete    RADIOLOGY:  No results found.  EKG:   Orders placed or performed during the hospital encounter of 03/24/15  . EKG 12-Lead  . EKG 12-Lead  . ED EKG within 10 minutes  . ED EKG within 10 minutes      Management plans discussed with the patient, family and they are in agreement.  CODE STATUS:     Code Status Orders        Start     Ordered   03/24/15 2201  Do not attempt resuscitation (DNR)   Continuous    Question Answer Comment  In the event of cardiac or respiratory ARREST Do not call a "code blue"   In the event of cardiac or respiratory ARREST Do not perform Intubation, CPR, defibrillation or ACLS   In the event of cardiac or respiratory ARREST Use medication by any route, position, wound care, and other measures to relive pain and suffering. May use oxygen, suction and manual treatment of airway obstruction as needed for comfort.      03/24/15 2200    Advance Directive Documentation        Most Recent Value   Type of Advance Directive   Healthcare Power of Attorney   Pre-existing out of facility DNR order (yellow form or pink MOST form)     "MOST" Form in Place?        TOTAL TIME TAKING CARE OF THIS PATIENT: 40  minutes.    Theodoro Grist M.D on 03/27/2015 at 10:27 AM  Between 7am to 6pm - Pager - [phone]  After 6pm go to [email or web address] - password [email or web address] [facility]  [facility]  Office  [phone]  CC: Primary care physician; No primary care provider on file.

## 2015-03-27 NOTE — Telephone Encounter (Signed)
Attempted to contact pt for TCM call.  No answer, no vm at home #. Will attempt again later today, as pt may not have been released from [facility] yet.

## 2015-03-27 NOTE — Telephone Encounter (Signed)
Attempted again to contact pt. No answer, no voicemail, unable to leave message.

## 2015-03-28 LAB — URINE CULTURE

## 2015-04-13 NOTE — Progress Notes (Signed)
03/26/15 1200  PT Visit Information  Last PT Received On 05/25/14  Assistance Needed +1  History of Present Illness 81 yo female with onset of chest pain and dementia, with elevated troponin and HTN, not identified as NSTEMI.    Precautions  Precautions Fall;Other (comment) (telemetry)  Restrictions  Weight Bearing Restrictions No  Home Living  Family/patient expects to be discharged to: Private residence  Living Arrangements Alone  Available Help at Discharge Family;Available PRN/intermittently  Type of Home House  Home Access (pt cannot accurately answer questions)  [place] - 2 wheels (per pt but not a reliable historian)  Prior Function  Level of Independence Independent with assistive device(s)  Communication  Communication No difficulties (confused)  Pain Assessment  Pain Assessment No/denies pain  Cognition  Arousal/Alertness Awake/alert  Behavior During Therapy Anxious (confusion)  Overall Cognitive Status History of cognitive impairments - at baseline  Memory Decreased recall of precautions;Decreased short-term memory  Upper Extremity Assessment  Upper Extremity Assessment Overall WFL for tasks assessed  Lower Extremity Assessment  Lower Extremity Assessment Generalized weakness  Cervical / Trunk Assessment  Cervical / Trunk Assessment Kyphotic  Bed Mobility  Overal bed mobility Needs Assistance  Bed Mobility Supine to Sit  Supine to sit Mod assist  Transfers  Overall transfer level Needs assistance  Equipment used Rolling walker (2 wheeled);1 person hand held assist  Transfers Sit to/from Stand;Stand Pivot Transfers  Sit to Stand Min assist  Stand pivot transfers Min assist  General transfer comment reminders fro hand placement  Ambulation/Gait  Ambulation/Gait assistance Min assist  Ambulation Distance (Feet) 100 Feet  Assistive device Rolling walker (2 wheeled);1 person hand held assist  Gait Pattern/deviations Step-through pattern;Wide  base of support;Shuffle;Decreased stride length  Gait velocity reduced slow turns  Gait velocity interpretation Below normal speed for age/gender  Balance  Overall balance assessment Needs assistance  Sitting-balance support Feet supported  Sitting balance-Leahy Scale Fair  Standing balance support Bilateral upper extremity supported  Standing balance-Leahy Scale Poor  General Comments  General comments (skin integrity, edema, etc.) Pt is having some low HR per nsg immediately after sitting up in chair from walk per telemetry staff, but had normal HR of 66 just prior to her walk.  Rates in 40's right afterward.  PT - End of Session  Equipment Utilized During Treatment Gait belt  Activity Tolerance Patient tolerated treatment well  Patient left in chair;with call bell/phone within reach;with chair alarm set;with nursing/sitter in room  Nurse Communication Mobility status  PT Assessment  PT Therapy Diagnosis  Abnormality of gait  PT Recommendation/Assessment Patient needs continued PT services  PT Problem List Decreased strength;Decreased range of motion;Decreased activity tolerance;Decreased balance;Decreased coordination;Decreased mobility;Decreased cognition;Decreased knowledge of use of DME;Decreased safety awareness;Decreased knowledge of precautions;Cardiopulmonary status limiting activity  Barriers to Discharge Inaccessible home environment;Decreased caregiver support  Barriers to Discharge Comments son lives 20 miles away  PT Plan  PT Frequency (ACUTE ONLY) Min 2X/week  PT Treatment/Interventions (ACUTE ONLY) DME instruction;Gait training;Functional mobility training;Therapeutic activities;Therapeutic exercise;Balance training;Neuromuscular re-education;Cognitive remediation;Patient/family education  PT Recommendation  Recommendations for Other Services Rehab consult  Follow Up Recommendations SNF  PT equipment None recommended by PT (await disposition)  Individuals Consulted   Consulted and Agree with Results and Recommendations Patient  Acute Rehab PT Goals  Patient Stated Goal none stated  PT Goal Formulation With patient  Time For Goal Achievement 04/09/15  Potential to Achieve Goals Fair  PT Time Calculation  PT Start Time (ACUTE ONLY) 1059  PT Stop Time (ACUTE ONLY) 1128  PT Time Calculation (min) (ACUTE ONLY) 29 min  PT G-Codes **NOT FOR INPATIENT CLASS**  Functional Assessment Tool Used clinical judgment  Functional Limitation Mobility: Walking and moving around  Mobility: Walking and Moving Around Current Status [id number]) CJ  Mobility: Walking and Moving Around Goal Status [id number]) CJ  PT General Charges  $$ ACUTE PT VISIT 1 Procedure  PT Evaluation  $Initial PT Evaluation Tier I 1 Procedure  PT Treatments  $Gait Training 8-22 mins  Late entry for G code.  Mee Hives, PT MS Acute Rehab Dept. Number: [id number] and [place] [phone]

## 2015-04-13 NOTE — Progress Notes (Signed)
03/26/15 1200  PT Visit Information  Last PT Received On 05/25/14  Assistance Needed +1  History of Present Illness 81 yo female with onset of chest pain and dementia, with elevated troponin and HTN, not identified as NSTEMI.    Precautions  Precautions Fall;Other (comment) (telemetry)  Restrictions  Weight Bearing Restrictions No  Home Living  Family/patient expects to be discharged to: Private residence  Living Arrangements Alone  Available Help at Discharge Family;Available PRN/intermittently  Type of Home House  Home Access (pt cannot accurately answer questions)  [place] - 2 wheels (per pt but not a reliable historian)  Prior Function  Level of Independence Independent with assistive device(s)  Communication  Communication No difficulties (confused)  Pain Assessment  Pain Assessment No/denies pain  Cognition  Arousal/Alertness Awake/alert  Behavior During Therapy Anxious (confusion)  Overall Cognitive Status History of cognitive impairments - at baseline  Memory Decreased recall of precautions;Decreased short-term memory  Upper Extremity Assessment  Upper Extremity Assessment Overall WFL for tasks assessed  Lower Extremity Assessment  Lower Extremity Assessment Generalized weakness  Cervical / Trunk Assessment  Cervical / Trunk Assessment Kyphotic  Bed Mobility  Overal bed mobility Needs Assistance  Bed Mobility Supine to Sit  Supine to sit Mod assist  Transfers  Overall transfer level Needs assistance  Equipment used Rolling walker (2 wheeled);1 person hand held assist  Transfers Sit to/from Stand;Stand Pivot Transfers  Sit to Stand Min assist  Stand pivot transfers Min assist  General transfer comment reminders fro hand placement  Ambulation/Gait  Ambulation/Gait assistance Min assist  Ambulation Distance (Feet) 100 Feet  Assistive device Rolling walker (2 wheeled);1 person hand held assist  Gait Pattern/deviations Step-through pattern;Wide  base of support;Shuffle;Decreased stride length  Gait velocity reduced slow turns  Gait velocity interpretation Below normal speed for age/gender  Balance  Overall balance assessment Needs assistance  Sitting-balance support Feet supported  Sitting balance-Leahy Scale Fair  Standing balance support Bilateral upper extremity supported  Standing balance-Leahy Scale Poor  General Comments  General comments (skin integrity, edema, etc.) Pt is having some low HR per nsg immediately after sitting up in chair from walk per telemetry staff, but had normal HR of 66 just prior to her walk.  Rates in 40's right afterward.  PT - End of Session  Equipment Utilized During Treatment Gait belt  Activity Tolerance Patient tolerated treatment well  Patient left in chair;with call bell/phone within reach;with chair alarm set;with nursing/sitter in room  Nurse Communication Mobility status  PT Assessment  PT Therapy Diagnosis  Abnormality of gait  PT Recommendation/Assessment Patient needs continued PT services  PT Problem List Decreased strength;Decreased range of motion;Decreased activity tolerance;Decreased balance;Decreased coordination;Decreased mobility;Decreased cognition;Decreased knowledge of use of DME;Decreased safety awareness;Decreased knowledge of precautions;Cardiopulmonary status limiting activity  Barriers to Discharge Inaccessible home environment;Decreased caregiver support  Barriers to Discharge Comments son lives 20 miles away  PT Plan  PT Frequency (ACUTE ONLY) Min 2X/week  PT Treatment/Interventions (ACUTE ONLY) DME instruction;Gait training;Functional mobility training;Therapeutic activities;Therapeutic exercise;Balance training;Neuromuscular re-education;Cognitive remediation;Patient/family education  PT Recommendation  Recommendations for Other Services Rehab consult  Follow Up Recommendations SNF  PT equipment None recommended by PT (await disposition)  Individuals Consulted   Consulted and Agree with Results and Recommendations Patient  Acute Rehab PT Goals  Patient Stated Goal none stated  PT Goal Formulation With patient  Time For Goal Achievement 04/09/15  Potential to Achieve Goals Fair  PT Time Calculation  PT Start Time (ACUTE ONLY) 1059  PT Stop Time (ACUTE ONLY) 1128  PT Time Calculation (min) (ACUTE ONLY) 29 min  PT G-Codes **NOT FOR INPATIENT CLASS**  Functional Assessment Tool Used clinical judgment  Functional Limitation Mobility: Walking and moving around  Mobility: Walking and Moving Around Current Status [id number]) CJ  Mobility: Walking and Moving Around Goal Status [id number]) CJ  PT General Charges  $$ ACUTE PT VISIT 1 Procedure  PT Evaluation  $Initial PT Evaluation Tier I 1 Procedure  PT Treatments  $Gait Training 8-22 mins  Late entry for adding a G code  Mee Hives, PT MS Acute Rehab Dept. Number: [id number] and [place] [phone]

## 2015-04-29 ENCOUNTER — Emergency Department: Payer: Medicare Other

## 2015-04-29 ENCOUNTER — Encounter: Payer: Self-pay | Admitting: Emergency Medicine

## 2015-04-29 ENCOUNTER — Emergency Department
Admission: EM | Admit: 2015-04-29 | Discharge: 2015-04-30 | Disposition: A | Discharge status: Home / Self Care | Payer: Medicare Other | Attending: Emergency Medicine | Admitting: Emergency Medicine

## 2015-04-29 DIAGNOSIS — S42202A Unspecified fracture of upper end of left humerus, initial encounter for closed fracture: Secondary | ICD-10-CM | POA: Diagnosis not present

## 2015-04-29 DIAGNOSIS — S52022A Displaced fracture of olecranon process without intraarticular extension of left ulna, initial encounter for closed fracture: Secondary | ICD-10-CM | POA: Insufficient documentation

## 2015-04-29 DIAGNOSIS — S0081XA Abrasion of other part of head, initial encounter: Secondary | ICD-10-CM | POA: Insufficient documentation

## 2015-04-29 DIAGNOSIS — S4992XA Unspecified injury of left shoulder and upper arm, initial encounter: Secondary | ICD-10-CM | POA: Diagnosis present

## 2015-04-29 DIAGNOSIS — S42412A Displaced simple supracondylar fracture without intercondylar fracture of left humerus, initial encounter for closed fracture: Secondary | ICD-10-CM | POA: Diagnosis not present

## 2015-04-29 DIAGNOSIS — I10 Essential (primary) hypertension: Secondary | ICD-10-CM | POA: Diagnosis not present

## 2015-04-29 DIAGNOSIS — Z7982 Long term (current) use of aspirin: Secondary | ICD-10-CM | POA: Diagnosis not present

## 2015-04-29 DIAGNOSIS — Y9389 Activity, other specified: Secondary | ICD-10-CM | POA: Insufficient documentation

## 2015-04-29 DIAGNOSIS — Z79899 Other long term (current) drug therapy: Secondary | ICD-10-CM | POA: Diagnosis not present

## 2015-04-29 DIAGNOSIS — W1839XA Other fall on same level, initial encounter: Secondary | ICD-10-CM | POA: Diagnosis not present

## 2015-04-29 DIAGNOSIS — Y998 Other external cause status: Secondary | ICD-10-CM | POA: Insufficient documentation

## 2015-04-29 DIAGNOSIS — Y9289 Other specified places as the place of occurrence of the external cause: Secondary | ICD-10-CM | POA: Diagnosis not present

## 2015-04-29 LAB — CBC WITH DIFFERENTIAL/PLATELET
Basophils Absolute: 0 10*3/uL (ref 0–0.1)
Basophils Relative: 0 %
Eosinophils Absolute: 0 10*3/uL (ref 0–0.7)
Eosinophils Relative: 0 %
HEMATOCRIT: 38.7 % (ref 35.0–47.0)
HEMOGLOBIN: 12.5 g/dL (ref 12.0–16.0)
LYMPHS ABS: 0.9 10*3/uL — AB (ref 1.0–3.6)
Lymphocytes Relative: 5 %
MCH: 30.8 pg (ref 26.0–34.0)
MCHC: 32.3 g/dL (ref 32.0–36.0)
MCV: 95.4 fL (ref 80.0–100.0)
MONOS PCT: 8 %
Monocytes Absolute: 1.7 10*3/uL — ABNORMAL HIGH (ref 0.2–0.9)
NEUTROS ABS: 17.5 10*3/uL — AB (ref 1.4–6.5)
NEUTROS PCT: 87 %
Platelets: 226 10*3/uL (ref 150–440)
RBC: 4.05 MIL/uL (ref 3.80–5.20)
RDW: 14.7 % — ABNORMAL HIGH (ref 11.5–14.5)
WBC: 20.1 10*3/uL — ABNORMAL HIGH (ref 3.6–11.0)

## 2015-04-29 LAB — BASIC METABOLIC PANEL
Anion gap: 9 (ref 5–15)
BUN: 46 mg/dL — ABNORMAL HIGH (ref 6–20)
CHLORIDE: 106 mmol/L (ref 101–111)
CO2: 25 mmol/L (ref 22–32)
CREATININE: 1.93 mg/dL — AB (ref 0.44–1.00)
Calcium: 8.9 mg/dL (ref 8.9–10.3)
GFR calc non Af Amer: 23 mL/min — ABNORMAL LOW (ref >60)
GFR, EST AFRICAN AMERICAN: 27 mL/min — AB (ref >60)
Glucose, Bld: 141 mg/dL — ABNORMAL HIGH (ref 65–99)
POTASSIUM: 4.6 mmol/L (ref 3.5–5.1)
Sodium: 140 mmol/L (ref 135–145)

## 2015-04-29 NOTE — ED Provider Notes (Signed)
[facility] Emergency Department Provider Note  ____________________________________________  Time seen: 11:15 PM  I have reviewed the triage vital signs and the nursing notes.   HISTORY  Chief Complaint Arm Injury    HPI Rachel Rush is a 81 y.o. female presents from [facility] status post "fall at dinnertime".Per report from mobile x-ray presented on presentation emergency department patient has a acute nondisplaced proximal humerus fracture. Patient also noted to have a left frontotemporal abrasion    Past Medical History  Diagnosis Date  . Hypothyroidism   . Cancer ([place])   . GERD (gastroesophageal reflux disease)   . Hypertension   . Stenosis of carotid artery     a. s/p bilateral carotid enarterectomy  . Alzheimer's dementia   . Colostomy in place [facility])     a. since age 62  . HLD (hyperlipidemia)     Patient Active Problem List   Diagnosis Date Noted  . Essential hypertension, malignant 03/27/2015  . Renal insufficiency 03/27/2015  . Mild concentric left ventricular hypertrophy (LVH) 03/27/2015  . Pulmonary hypertension ([place]) 03/27/2015  . Generalized weakness 03/27/2015  . Pain in the chest   . Confusion   . Elevated troponin   . Elevated white blood cell count   . Acute non Q wave MI (myocardial infarction), initial episode of care [facility]) 03/24/2015    Past Surgical History  Procedure Laterality Date  . Colon surgery    . Hip surgery    . Endarterectomy      Current Outpatient Rx  Name  Route  Sig  Dispense  Refill  . amLODipine (NORVASC) 10 MG tablet   Oral   Take 1 tablet (10 mg total) by mouth 2 (two) times daily. Patient taking differently: Take 5 mg by mouth 2 (two) times daily.    30 tablet   6   . aspirin EC 81 MG tablet   Oral   Take 81 mg by mouth daily.         . Calcium Carb-Cholecalciferol (CALCIUM 600+D) 600-800 MG-UNIT TABS   Oral   Take 1 tablet by mouth 2 (two) times daily.         Marland Kitchen  donepezil (ARICEPT) 5 MG tablet   Oral   Take 5 mg by mouth daily.          Marland Kitchen levothyroxine (SYNTHROID, LEVOTHROID) 100 MCG tablet   Oral   Take 100 mcg by mouth daily before breakfast.         . maprotiline (LUDIOMIL) 25 MG tablet   Oral   Take 75 mg by mouth daily.         . metoprolol tartrate (LOPRESSOR) 25 MG tablet   Oral   Take 1 tablet (25 mg total) by mouth 2 (two) times daily. Patient taking differently: Take 37.5 mg by mouth 2 (two) times daily.    60 tablet   6   . Multiple Vitamin (MULTIVITAMIN WITH MINERALS) TABS tablet   Oral   Take 1 tablet by mouth every evening.         . Omega-3 Fatty Acids (FISH OIL) 1000 MG CAPS   Oral   Take 1,000 mg by mouth daily.         . pantoprazole (PROTONIX) 40 MG tablet   Oral   Take 1 tablet (40 mg total) by mouth daily.   30 tablet   2   . telmisartan (MICARDIS) 80 MG tablet   Oral   Take 80  mg by mouth daily.         . vitamin B-12 (CYANOCOBALAMIN) 1000 MCG tablet   Oral   Take 1,000 mcg by mouth daily.         . vitamin E (VITAMIN E) 400 UNIT capsule   Oral   Take 400 Units by mouth daily.           Allergies Cod; Codeine; and Morphine and related  Family History  Problem Relation Age of Onset  . CAD Mother   . Diabetes Mellitus II Mother   . Alcoholism Father   . Prostate cancer Father     Social History Social History  Substance Use Topics  . Smoking status: Never Smoker   . Smokeless tobacco: None  . Alcohol Use: No    Review of Systems  Constitutional: Negative for fever. Eyes: Negative for visual changes. ENT: Negative for sore throat. Cardiovascular: Negative for chest pain. Respiratory: Negative for shortness of breath. Gastrointestinal: Negative for abdominal pain, vomiting and diarrhea. Genitourinary: Negative for dysuria. Musculoskeletal: Negative for back pain. Skin: Positive for abrasions Neurological: Negative for headaches, focal weakness or  numbness.   10-point ROS otherwise negative.  ____________________________________________   PHYSICAL EXAM:  VITAL SIGNS: ED Triage Vitals  Enc Vitals Group     BP 04/29/15 2235 115/52 mmHg     Pulse Rate 04/29/15 2235 56     Resp 04/29/15 2235 20     Temp 04/29/15 2235 97.3 F (36.3 C)     Temp Source 04/29/15 2235 Oral     SpO2 04/29/15 2235 96 %     Weight 04/29/15 2235 135 lb (61.236 kg)     Height 04/29/15 2235 5\' 7"  (1.702 m)     Head Cir --      [facility] --      Pain Score 04/29/15 2238 3     Pain Loc --      Pain Edu? --      Excl. in [place]? --     Constitutional: Alert and oriented. Well appearing and in no distress. Eyes: Conjunctivae are normal. PERRL. Normal extraocular movements. ENT   Head: Normocephalic and atraumatic.   Nose: No congestion/rhinnorhea.   Mouth/Throat: Mucous membranes are moist.   Neck: No stridor. Hematological/Lymphatic/Immunilogical: No cervical lymphadenopathy. Cardiovascular: Normal rate, regular rhythm. Normal and symmetric distal pulses are present in all extremities. No murmurs, rubs, or gallops. Respiratory: Normal respiratory effort without tachypnea nor retractions. Breath sounds are clear and equal bilaterally. No wheezes/rales/rhonchi. Gastrointestinal: Soft and nontender. No distention. There is no CVA tenderness. Genitourinary: deferred Musculoskeletal: Nontender with normal range of motion in all extremities. No joint effusions.  No lower extremity tenderness nor edema. Neurologic:  Normal speech and language. No gross focal neurologic deficits are appreciated. Speech is normal.  Skin:  Skin is warm, dry and intact. No rash noted. Psychiatric: Mood and affect are normal. Speech and behavior are normal. Patient exhibits appropriate insight and judgment.  ____________________________________________    LABS (pertinent positives/negatives)  Labs Reviewed  CBC WITH DIFFERENTIAL/PLATELET - Abnormal; Notable for  the following:    WBC 20.1 (*)    RDW 14.7 (*)    Neutro Abs 17.5 (*)    Lymphs Abs 0.9 (*)    Monocytes Absolute 1.7 (*)    All other components within normal limits  BASIC METABOLIC PANEL - Abnormal; Notable for the following:    Glucose, Bld 141 (*)    BUN 46 (*)    Creatinine,  Ser 1.93 (*)    GFR calc non Af Amer 23 (*)    GFR calc Af Amer 27 (*)    All other components within normal limits     ____________________________________________   EKG ED ECG REPORT I, Trenee Igoe, [place] N, the attending physician, personally viewed and interpreted this ECG.   Date: 04/30/2015  EKG Time: 10:57 PM  Rate: 57  Rhythm: Sinus bradycardia  Axis: None  Intervals:normal  ST&T Change: None     RADIOLOGY     DG Elbow 2 Views Left (Final result) Result time: 04/30/15 01:01:32   Final result by Rad Results In Interface (04/30/15 01:01:32)   Narrative:   CLINICAL DATA: Pain and swelling after fall. Initial encounter.  EXAM: LEFT ELBOW - 2 VIEW  COMPARISON: None.  FINDINGS: Acute olecranon process fracture which is distracted by 25 mm. In the fracture gap is an 11 mm long fragment. No dislocation. No evidence of additional fracture. Marked soft tissue swelling.  IMPRESSION: Distracted olecranon process fracture.   Electronically Signed By: Monte Fantasia M.D. On: 04/30/2015 01:01          DG HumerUS Left (Final result) Result time: 04/30/15 00:59:24   Final result by Rad Results In Interface (04/30/15 00:59:24)   Narrative:   CLINICAL DATA: Fall at dinner. Arm pain. Initial encounter.  EXAM: LEFT HUMERUS - 2+ VIEW  COMPARISON: None.  FINDINGS: There is a transverse fracture through the proximal humeral diaphysis with nearly 100% lateral displacement.  Olecranon process fracture which is distracted. Reference elbow radiography.  Severe glenohumeral osteoarthritis with pseudoarthrosis formation between the broad the humeral head and the  coracoid.  IMPRESSION: 1. Displaced proximal humeral diaphysis fracture. 2. Displaced olecranon process fracture. 3. Advanced glenohumeral osteoarthritis.   Electronically Signed By: Monte Fantasia M.D. On: 04/30/2015 00:59          CT Head Wo Contrast (Final result) Result time: 04/30/15 00:09:30   Final result by Rad Results In Interface (04/30/15 00:09:30)   Narrative:   CLINICAL DATA: Fall with left forehead and temporal injury.  EXAM: CT HEAD WITHOUT CONTRAST  TECHNIQUE: Contiguous axial images were obtained from the base of the skull through the vertex without intravenous contrast.  COMPARISON: 03/24/2015  FINDINGS: Skull and Sinuses:Contusion and probable laceration around the left lateral orbital rim. There is no underlying fracture.  Retained secretions in the bilateral sphenoid sinuses without mucosal thickening, considered incidental given the history.  Visualized orbits: Negative.  Brain: No evidence of acute infarction, hemorrhage, hydrocephalus, or mass lesion/mass effect. Generalized cerebral volume loss with ventriculomegaly, age congruent. Mild chronic small vessel disease seen around the lateral ventricles.  IMPRESSION: No evidence of intracranial injury.   Electronically Signed By: Monte Fantasia M.D. On: 04/30/2015 00:09         INITIAL IMPRESSION / ASSESSMENT AND PLAN / ED COURSE  Pertinent labs & imaging results that were available during my care of the patient were reviewed by me and considered in my medical decision making (see chart for details). Posterior splint applied to left lower extremity. Sling applied. Patient discussed with Dr. Roland Rack who agreed with intervention thus far with condition for follow-up on Friday   ____________________________________________   FINAL CLINICAL IMPRESSION(S) / ED DIAGNOSES  Final diagnoses:  Left supracondylar humerus fracture, closed, initial encounter       Gregor Hams, MD 04/30/15 [phone]

## 2015-04-29 NOTE — ED Notes (Signed)
Per EMS pt. Had fall at dinner tonight.  Mobile x-ray at facility([facility]) stated fracture to lt. Humerus.

## 2015-04-29 NOTE — ED Notes (Signed)
Pt. Staff a [facility] "Pt. Fell at dinner time"  Pt. Given x-ray at facility that showed an acute non-displaced comminuted fracture at the proximal humerus.  Pt. States pain only upon movement of lt. Arm.  Pt. Has bandage above lt. Eye, bleeding controlled.  Pt. Also has 2 bandaged sites on lt. Arm, one on lt. Wrist and lt. Elbow, bleeding controlled at this time.

## 2015-04-30 ENCOUNTER — Emergency Department: Payer: Medicare Other

## 2015-04-30 NOTE — Discharge Instructions (Signed)
Elbow Fracture, Simple A fracture is a break in one of the bones.When fractures are not displaced or separated, they may be treated with only a sling or splint. The sling or splint may only be required for two to three weeks. In these cases, often the elbow is put through early range of motion exercises to prevent the elbow from getting stiff. DIAGNOSIS  The diagnosis (learning what is wrong) of a fractured elbow is made by x-ray. These may be required before and after the elbow is put into a splint or cast. X-rays are taken after to make sure the bone pieces have not moved. HOME CARE INSTRUCTIONS   Only take over-the-counter or prescription medicines for pain, discomfort, or fever as directed by your caregiver.  If you have a splint held on with an elastic wrap and your hand or fingers become numb or cold and blue, loosen the wrap and reapply more loosely. See your caregiver if there is no relief.  You may use ice for twenty minutes, four times per day, for the first two to three days.  Use your elbow as directed.  See your caregiver as directed. It is very important to keep all follow-up referrals and appointments in order to avoid any long-term problems with your elbow including chronic pain or stiffness. SEEK IMMEDIATE MEDICAL CARE IF:   There is swelling or increasing pain in elbow.  You begin to lose feeling or experience numbness or tingling in your hand or fingers.  You develop swelling of the hand and fingers.  You get a cold or blue hand or fingers on affected side. MAKE SURE YOU:   Understand these instructions.  Will watch your condition.  Will get help right away if you are not doing well or get worse.   This information is not intended to replace advice given to you by your health care provider. Make sure you discuss any questions you have with your health care provider.   Document Released: 04/19/2001 Document Revised: 07/18/2011 Document Reviewed:  03/10/2009 Elsevier Interactive Patient Education 2016 [employer]  Humerus Fracture Treated With Immobilization The humerus is the large bone in your upper arm. You have a broken (fractured) humerus. These fractures are easily diagnosed with X-rays. TREATMENT  Simple fractures which will heal without disability are treated with simple immobilization. Immobilization means you will wear a cast, splint, or sling. You have a fracture which will do well with immobilization. The fracture will heal well simply by being held in a good position until it is stable enough to begin range of motion exercises. Do not take part in activities which would further injure your arm.  HOME CARE INSTRUCTIONS   Put ice on the injured area.  Put ice in a plastic bag.  Place a towel between your skin and the bag.  Leave the ice on for 15-20 minutes, 03-04 times a day.  If you have a cast:  Do not scratch the skin under the cast using sharp or pointed objects.  Check the skin around the cast every day. You may put lotion on any red or sore areas.  Keep your cast dry and clean.  If you have a splint:  Wear the splint as directed.  Keep your splint dry and clean.  You may loosen the elastic around the splint if your fingers become numb, tingle, or turn cold or blue.  If you have a sling:  Wear the sling as directed.  Do not put pressure on any part  of your cast or splint until it is fully hardened.  Your cast or splint can be protected during bathing with a plastic bag. Do not lower the cast or splint into water.  Only take over-the-counter or prescription medicines for pain, discomfort, or fever as directed by your caregiver.  Do range of motion exercises as instructed by your caregiver.  Follow up as directed by your caregiver. This is very important in order to avoid permanent injury or disability and chronic pain. SEEK IMMEDIATE MEDICAL CARE IF:   Your skin or nails in the injured arm  turn blue or gray.  Your arm feels cold or numb.  You develop severe pain in the injured arm.  You are having problems with the medicines you were given. MAKE SURE YOU:   Understand these instructions.  Will watch your condition.  Will get help right away if you are not doing well or get worse.   This information is not intended to replace advice given to you by your health care provider. Make sure you discuss any questions you have with your health care provider.   Document Released: 08/01/2000 Document Revised: 05/16/2014 Document Reviewed: 09/17/2014 Elsevier Interactive Patient Education [employer].

## 2015-05-05 ENCOUNTER — Encounter: Payer: Self-pay | Admitting: *Deleted

## 2015-05-05 ENCOUNTER — Encounter: Payer: Self-pay | Admitting: Physician Assistant

## 2015-05-05 ENCOUNTER — Encounter: Payer: Medicare Other | Admitting: Physician Assistant

## 2015-05-10 DEATH — deceased

## 2016-07-09 IMAGING — CT CT HEAD W/O CM
1 series · 16 of 30 positions shown, 20 images · non-contrast
Comparison: 03/24/2015

CLINICAL DATA: Fall with left forehead and temporal injury.

EXAM:
CT HEAD WITHOUT CONTRAST
TECHNIQUE: Contiguous axial images were obtained from the base of the skull
through the vertex without intravenous contrast.

[Series 2: head wo · axial · 0.42mm/px · z∈[-174,-48]mm · 16 of 32 slices shown, 20 images]
[im 2/32  brain]
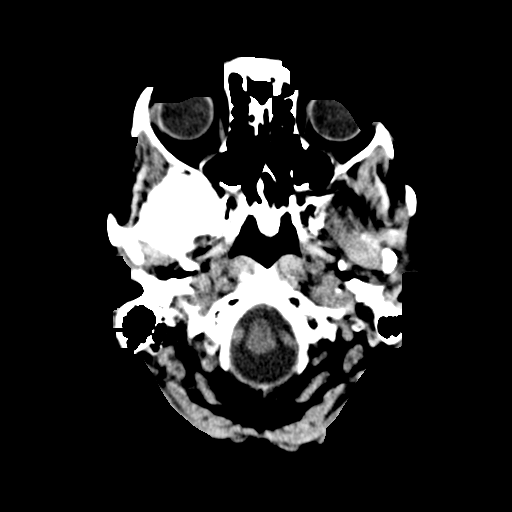
[im 2/32  bone]
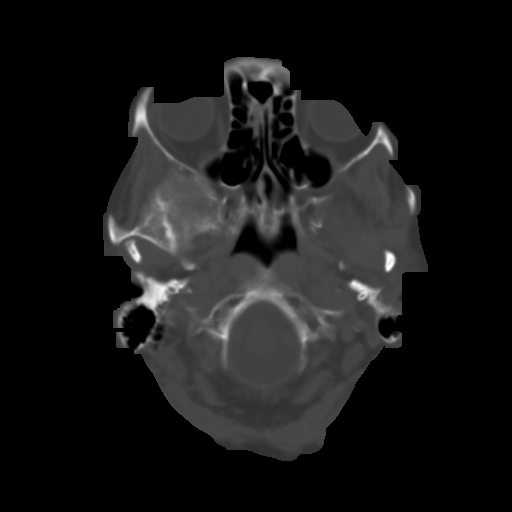
[im 4/32  brain]
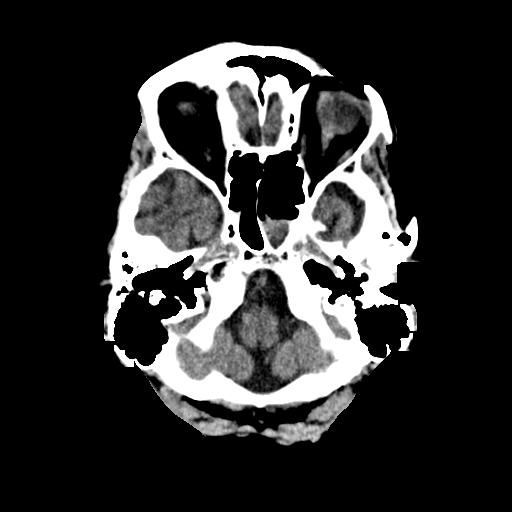
[im 6/32  brain]
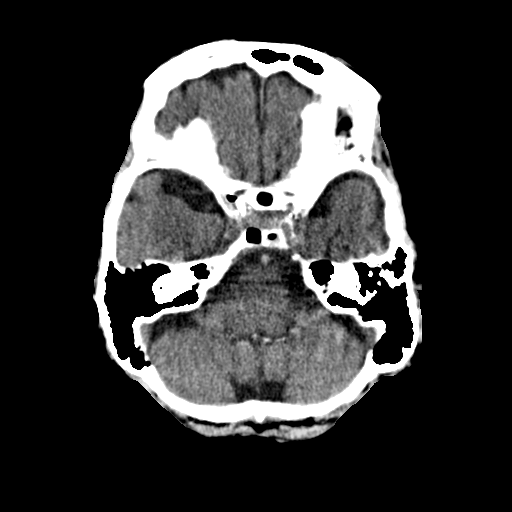
[im 8/32  brain]
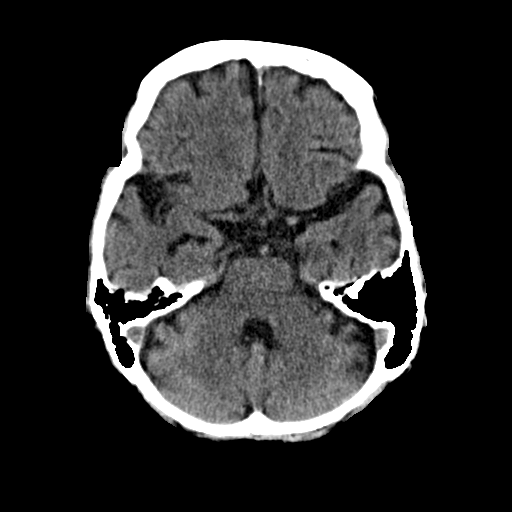
[im 9/32  brain]
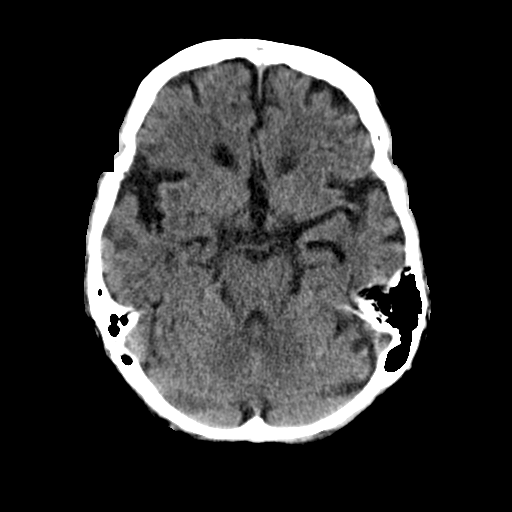
[im 9/32  bone]
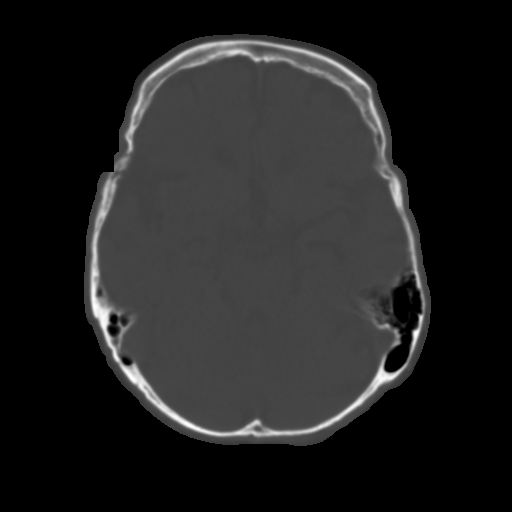
[im 11/32  brain]
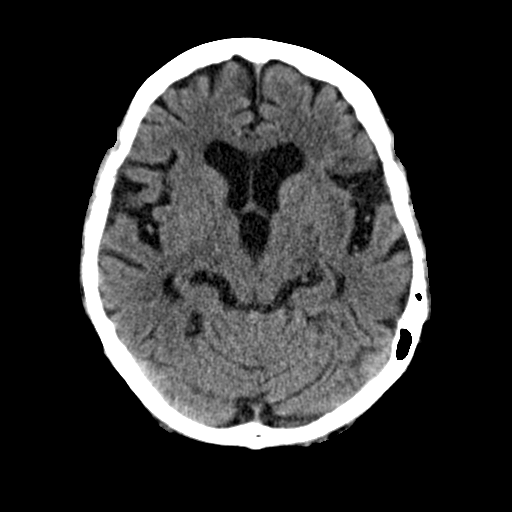
[im 13/32  brain]
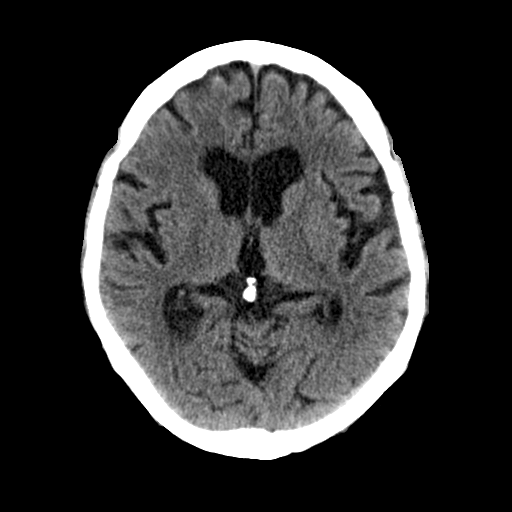
[im 15/32  brain]
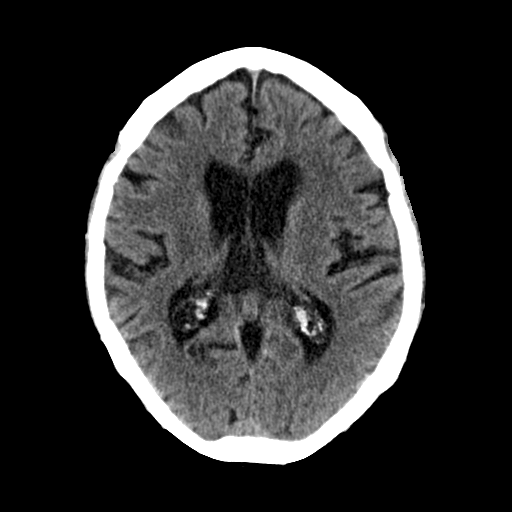
[im 17/32  brain]
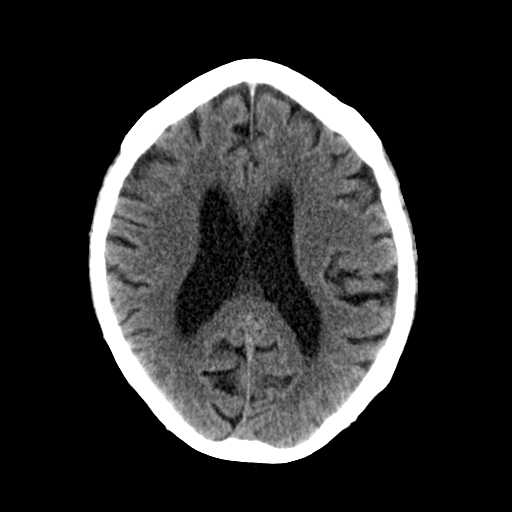
[im 17/32  bone]
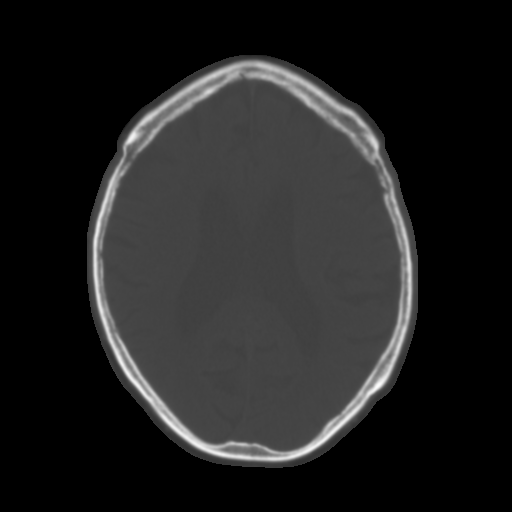
[im 19/32  brain]
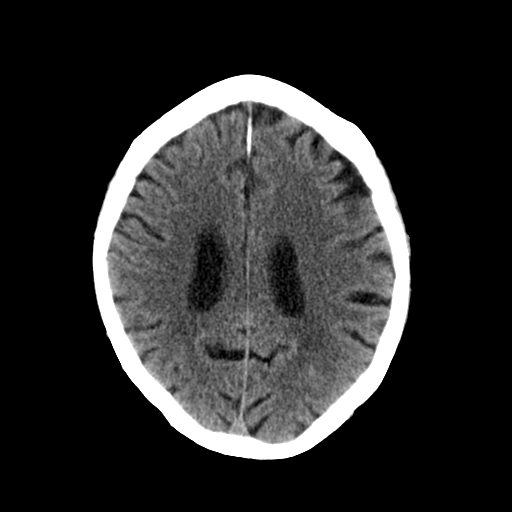
[im 21/32  brain]
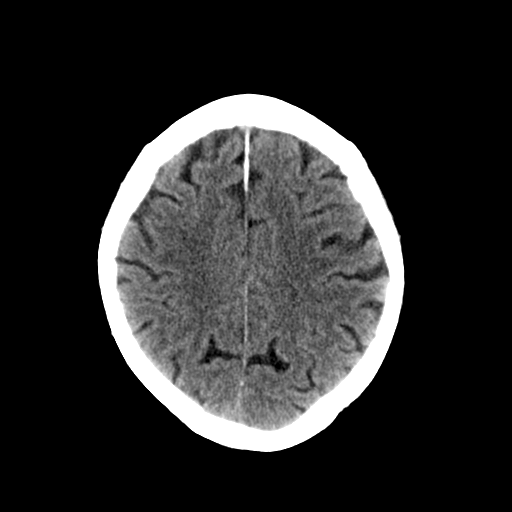
[im 23/32  brain]
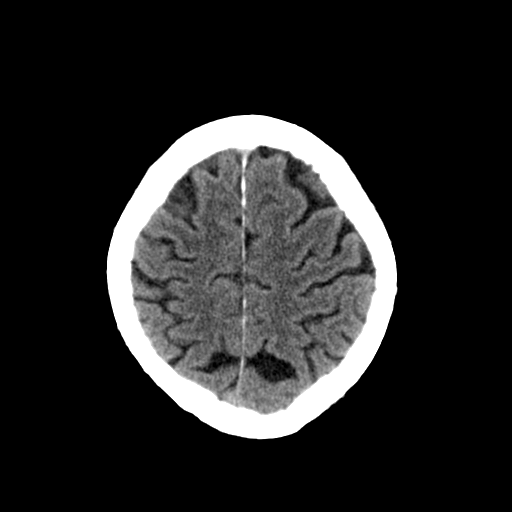
[im 24/32  brain]
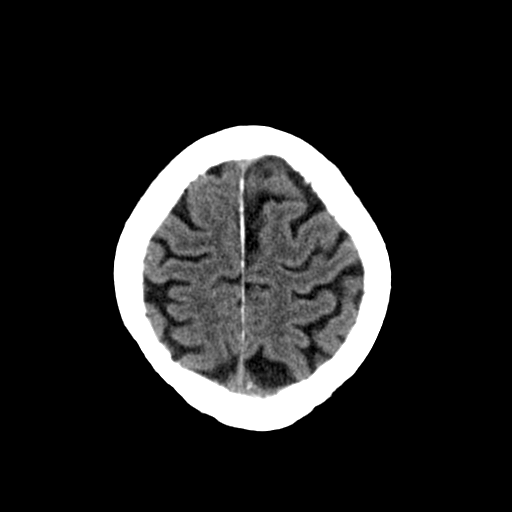
[im 24/32  bone]
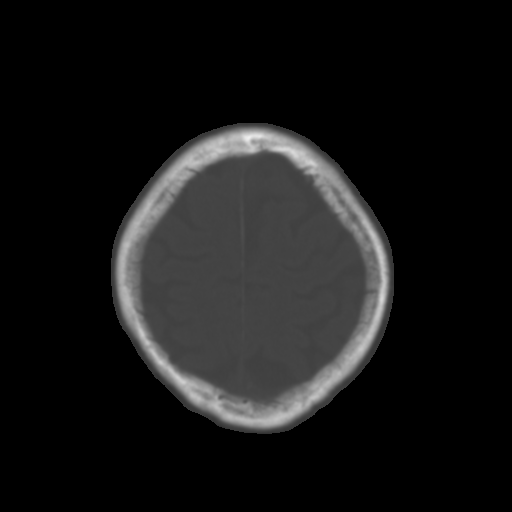
[im 26/32  brain]
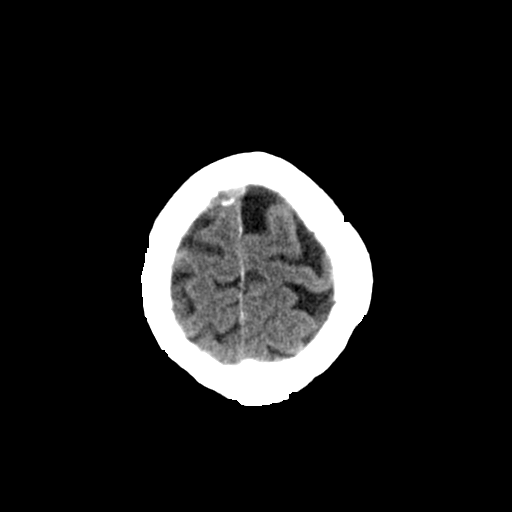
[im 28/32  brain]
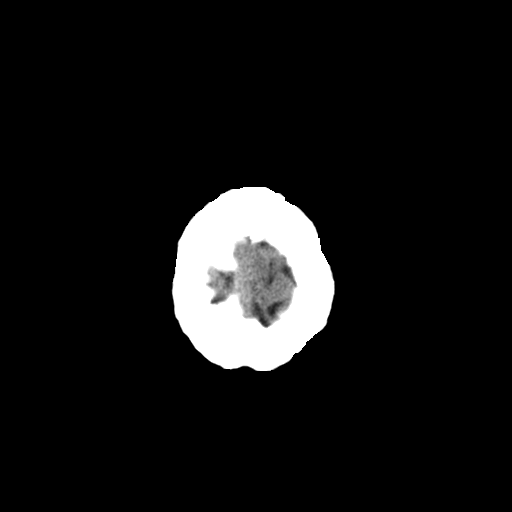
[im 30/32  brain]
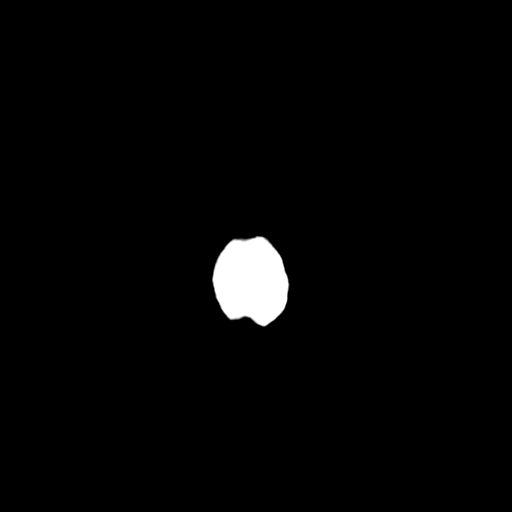

[16 of 30 positions shown; findings below may reference images not displayed]

FINDINGS: Skull and Sinuses:Contusion and probable laceration around the left
lateral orbital rim. There is no underlying fracture.

Retained secretions in the bilateral sphenoid sinuses without
mucosal thickening, considered incidental given the history.

Visualized orbits: Negative.

Brain: No evidence of acute infarction, hemorrhage, hydrocephalus,
or mass lesion/mass effect. Generalized cerebral volume loss with
ventriculomegaly, age congruent. Mild chronic small vessel disease
seen around the lateral ventricles.
IMPRESSION: No evidence of intracranial injury.
